# Patient Record
Sex: Male | Born: 1975
Health system: Southern US, Community
[De-identification: ages and names within clinical notes are randomized; demographics above are authoritative.]

## PROBLEM LIST (undated history)

## (undated) ENCOUNTER — Emergency Department (HOSPITAL_COMMUNITY): Disposition: A | Discharge status: Home / Self Care | Payer: Self-pay | Source: Home / Self Care

## (undated) DIAGNOSIS — G44009 Cluster headache syndrome, unspecified, not intractable: Secondary | ICD-10-CM

## (undated) DIAGNOSIS — T4145XA Adverse effect of unspecified anesthetic, initial encounter: Secondary | ICD-10-CM

## (undated) DIAGNOSIS — G500 Trigeminal neuralgia: Secondary | ICD-10-CM

## (undated) DIAGNOSIS — M792 Neuralgia and neuritis, unspecified: Secondary | ICD-10-CM

## (undated) DIAGNOSIS — G35 Multiple sclerosis: Secondary | ICD-10-CM

## (undated) DIAGNOSIS — J449 Chronic obstructive pulmonary disease, unspecified: Secondary | ICD-10-CM

## (undated) DIAGNOSIS — T8859XA Other complications of anesthesia, initial encounter: Secondary | ICD-10-CM

## (undated) DIAGNOSIS — R112 Nausea with vomiting, unspecified: Secondary | ICD-10-CM

## (undated) DIAGNOSIS — G40909 Epilepsy, unspecified, not intractable, without status epilepticus: Secondary | ICD-10-CM

## (undated) DIAGNOSIS — R569 Unspecified convulsions: Secondary | ICD-10-CM

## (undated) DIAGNOSIS — Z9889 Other specified postprocedural states: Secondary | ICD-10-CM

## (undated) HISTORY — PX: UPPER GASTROINTESTINAL ENDOSCOPY: SHX188

## (undated) HISTORY — PX: WISDOM TOOTH EXTRACTION: SHX21

---

## 2001-05-21 ENCOUNTER — Emergency Department (HOSPITAL_COMMUNITY): Admission: EM | Admit: 2001-05-21 | Discharge: 2001-05-21 | Payer: Self-pay

## 2001-05-24 ENCOUNTER — Emergency Department (HOSPITAL_COMMUNITY): Admission: EM | Admit: 2001-05-24 | Discharge: 2001-05-24 | Payer: Self-pay | Admitting: Emergency Medicine

## 2001-05-28 ENCOUNTER — Encounter (HOSPITAL_COMMUNITY): Admission: RE | Admit: 2001-05-28 | Discharge: 2001-08-26 | Payer: Self-pay | Admitting: Emergency Medicine

## 2001-05-29 ENCOUNTER — Emergency Department (HOSPITAL_COMMUNITY): Admission: EM | Admit: 2001-05-29 | Discharge: 2001-05-29 | Payer: Self-pay | Admitting: Emergency Medicine

## 2001-06-19 ENCOUNTER — Emergency Department (HOSPITAL_COMMUNITY): Admission: EM | Admit: 2001-06-19 | Discharge: 2001-06-19 | Payer: Self-pay | Admitting: Emergency Medicine

## 2012-07-31 ENCOUNTER — Encounter (HOSPITAL_BASED_OUTPATIENT_CLINIC_OR_DEPARTMENT_OTHER): Payer: Self-pay | Admitting: Emergency Medicine

## 2012-07-31 ENCOUNTER — Emergency Department (HOSPITAL_BASED_OUTPATIENT_CLINIC_OR_DEPARTMENT_OTHER): Payer: Medicare Other

## 2012-07-31 ENCOUNTER — Emergency Department (HOSPITAL_BASED_OUTPATIENT_CLINIC_OR_DEPARTMENT_OTHER)
Admission: EM | Admit: 2012-07-31 | Discharge: 2012-07-31 | Disposition: A | Discharge status: Home / Self Care | Payer: Medicare Other | Attending: Emergency Medicine | Admitting: Emergency Medicine

## 2012-07-31 DIAGNOSIS — R1031 Right lower quadrant pain: Secondary | ICD-10-CM | POA: Insufficient documentation

## 2012-07-31 DIAGNOSIS — G35 Multiple sclerosis: Secondary | ICD-10-CM | POA: Insufficient documentation

## 2012-07-31 DIAGNOSIS — R197 Diarrhea, unspecified: Secondary | ICD-10-CM | POA: Insufficient documentation

## 2012-07-31 DIAGNOSIS — R109 Unspecified abdominal pain: Secondary | ICD-10-CM

## 2012-07-31 DIAGNOSIS — Z87891 Personal history of nicotine dependence: Secondary | ICD-10-CM | POA: Insufficient documentation

## 2012-07-31 DIAGNOSIS — R110 Nausea: Secondary | ICD-10-CM | POA: Insufficient documentation

## 2012-07-31 HISTORY — DX: Cluster headache syndrome, unspecified, not intractable: G44.009

## 2012-07-31 HISTORY — DX: Multiple sclerosis: G35

## 2012-07-31 LAB — CBC
HCT: 45.1 % (ref 39.0–52.0)
MCH: 30.5 pg (ref 26.0–34.0)
MCV: 84.9 fL (ref 78.0–100.0)
Platelets: 205 10*3/uL (ref 150–400)
RDW: 12 % (ref 11.5–15.5)

## 2012-07-31 LAB — URINALYSIS, ROUTINE W REFLEX MICROSCOPIC
Bilirubin Urine: NEGATIVE
Glucose, UA: NEGATIVE mg/dL
Hgb urine dipstick: NEGATIVE
Protein, ur: NEGATIVE mg/dL
Urobilinogen, UA: 0.2 mg/dL (ref 0.0–1.0)

## 2012-07-31 LAB — COMPREHENSIVE METABOLIC PANEL
AST: 17 U/L (ref 0–37)
BUN: 14 mg/dL (ref 6–23)
CO2: 27 mEq/L (ref 19–32)
Calcium: 9.5 mg/dL (ref 8.4–10.5)
Creatinine, Ser: 0.9 mg/dL (ref 0.50–1.35)
GFR calc non Af Amer: >90 mL/min (ref >90)
Total Bilirubin: 0.7 mg/dL (ref 0.3–1.2)

## 2012-07-31 MED ORDER — IOHEXOL 300 MG/ML  SOLN: 25.0000 mL | INTRAMUSCULAR | Status: AC

## 2012-07-31 MED ORDER — SODIUM CHLORIDE 0.9 % IV BOLUS (SEPSIS)
1000.0000 mL | Freq: Once | INTRAVENOUS | Status: AC
  Administered 2012-07-31: 1000 mL via INTRAVENOUS

## 2012-07-31 MED ORDER — HYDROCODONE-ACETAMINOPHEN 5-500 MG PO TABS: 1.0000 | ORAL_TABLET | Freq: Four times a day (QID) | ORAL | Status: DC | PRN

## 2012-07-31 MED ORDER — IOHEXOL 300 MG/ML  SOLN
100.0000 mL | Freq: Once | INTRAMUSCULAR | Status: AC | PRN
  Administered 2012-07-31: 100 mL via INTRAVENOUS

## 2012-07-31 MED ORDER — ONDANSETRON HCL 4 MG/2ML IJ SOLN
4.0000 mg | Freq: Once | INTRAMUSCULAR | Status: AC
  Administered 2012-07-31: 4 mg via INTRAVENOUS
  Filled 2012-07-31: qty 2

## 2012-07-31 MED ORDER — ONDANSETRON HCL 4 MG PO TABS: 4.0000 mg | ORAL_TABLET | Freq: Four times a day (QID) | ORAL | Status: DC

## 2012-07-31 MED ORDER — MORPHINE SULFATE 4 MG/ML IJ SOLN
4.0000 mg | Freq: Once | INTRAMUSCULAR | Status: AC
Start: 2012-07-31 — End: 2012-07-31
  Administered 2012-07-31: 4 mg via INTRAVENOUS
  Filled 2012-07-31: qty 1

## 2012-07-31 NOTE — ED Notes (Signed)
RLQ pain intermittently x2 months.  Constant and severe since yesterday.  Nausea and diarrhea.  Also some suprapubic pain.

## 2012-07-31 NOTE — ED Provider Notes (Signed)
History     CSN: [id number]  Arrival date & time 07/31/12  1635   First MD Initiated Contact with Patient 07/31/12 1813      Chief Complaint  Patient presents with  . Abdominal Pain  . Nausea    (Consider location/radiation/quality/duration/timing/severity/associated sxs/prior treatment) HPI Comments: Pt complains of intermittent pain to RLQ for the last 2 months.  Has been worse and constant for last 2 days.  No NV.  Had diarrhea yesterday.  No fevers.  Describes as achy pain to RLQ, at times worsens.  AT times radiates to back, groin.  No testicular pain.  No UTI symptoms other than frequency.  Not taking any meds for pain.  The history is provided by the patient.    Past Medical History  Diagnosis Date  . MS (multiple sclerosis)   . Cluster headache     Past Surgical History  Procedure Date  . Upper gastrointestinal endoscopy     No family history on file.  History  Substance Use Topics  . Smoking status: Former Games developer  . Smokeless tobacco: Not on file  . Alcohol Use: Yes     occasionally      Review of Systems  Constitutional: Negative for fever, chills, diaphoresis and fatigue.  HENT: Negative for congestion, rhinorrhea and sneezing.   Eyes: Negative.   Respiratory: Negative for cough, chest tightness and shortness of breath.   Cardiovascular: Negative for chest pain and leg swelling.  Gastrointestinal: Positive for abdominal pain and diarrhea. Negative for nausea, vomiting and blood in stool.  Genitourinary: Positive for frequency. Negative for hematuria, flank pain and difficulty urinating.  Musculoskeletal: Positive for back pain. Negative for arthralgias.  Skin: Negative for rash.  Neurological: Negative for dizziness, speech difficulty, weakness, numbness and headaches.    Allergies  Review of patient's allergies indicates no known allergies.  Home Medications   Current Outpatient Rx  Name Route Sig Dispense Refill  .  HYDROCODONE-ACETAMINOPHEN 5-500 MG PO TABS Oral Take 1-2 tablets by mouth every 6 (six) hours as needed for pain. 15 tablet 0  . ONDANSETRON HCL 4 MG PO TABS Oral Take 1 tablet (4 mg total) by mouth every 6 (six) hours. 12 tablet 0    BP 130/75  Pulse 98  Temp 98.3 F (36.8 C) (Oral)  Resp 18  Ht 5\' 10"  (1.778 m)  Wt 132 lb (59.875 kg)  BMI 18.94 kg/m2  SpO2 100%  Physical Exam  Constitutional: He is oriented to person, place, and time. He appears well-developed and well-nourished.  HENT:  Head: Normocephalic and atraumatic.  Eyes: Pupils are equal, round, and reactive to light.  Neck: Normal range of motion. Neck supple.  Cardiovascular: Normal rate, regular rhythm and normal heart sounds.   Pulmonary/Chest: Effort normal and breath sounds normal. No respiratory distress. He has no wheezes. He has no rales. He exhibits no tenderness.  Abdominal: Soft. Bowel sounds are normal. There is tenderness (moderate pain to RLQ). There is no rebound and no guarding.  Genitourinary:       No pain to inguinal area/testicle  Musculoskeletal: Normal range of motion. He exhibits no edema.  Lymphadenopathy:    He has no cervical adenopathy.  Neurological: He is alert and oriented to person, place, and time.  Skin: Skin is warm and dry. No rash noted.  Psychiatric: He has a normal mood and affect.    ED Course  Procedures (including critical care time)  Results for orders placed during the hospital encounter of  07/31/12  URINALYSIS, ROUTINE W REFLEX MICROSCOPIC      Component Value Range   Color, Urine YELLOW  YELLOW   APPearance CLEAR  CLEAR   Specific Gravity, Urine 1.027  1.005 - 1.030   pH 5.0  5.0 - 8.0   Glucose, UA NEGATIVE  NEGATIVE mg/dL   Hgb urine dipstick NEGATIVE  NEGATIVE   Bilirubin Urine NEGATIVE  NEGATIVE   Ketones, ur NEGATIVE  NEGATIVE mg/dL   Protein, ur NEGATIVE  NEGATIVE mg/dL   Urobilinogen, UA 0.2  0.0 - 1.0 mg/dL   Nitrite NEGATIVE  NEGATIVE   Leukocytes, UA  NEGATIVE  NEGATIVE  CBC      Component Value Range   WBC 7.6  4.0 - 10.5 K/uL   RBC 5.31  4.22 - 5.81 MIL/uL   Hemoglobin 16.2  13.0 - 17.0 g/dL   HCT 16.1  09.6 - 04.5 %   MCV 84.9  78.0 - 100.0 fL   MCH 30.5  26.0 - 34.0 pg   MCHC 35.9  30.0 - 36.0 g/dL   RDW 40.9  81.1 - 91.4 %   Platelets 205  150 - 400 K/uL  COMPREHENSIVE METABOLIC PANEL      Component Value Range   Sodium 137  135 - 145 mEq/L   Potassium 3.6  3.5 - 5.1 mEq/L   Chloride 99  96 - 112 mEq/L   CO2 27  19 - 32 mEq/L   Glucose, Bld 94  70 - 99 mg/dL   BUN 14  6 - 23 mg/dL   Creatinine, Ser 7.82  0.50 - 1.35 mg/dL   Calcium 9.5  8.4 - 95.6 mg/dL   Total Protein 7.0  6.0 - 8.3 g/dL   Albumin 4.2  3.5 - 5.2 g/dL   AST 17  0 - 37 U/L   ALT 15  0 - 53 U/L   Alkaline Phosphatase 112  39 - 117 U/L   Total Bilirubin 0.7  0.3 - 1.2 mg/dL   GFR calc non Af Amer >90  >90 mL/min   GFR calc Af Amer >90  >90 mL/min   Ct Abdomen Pelvis W Contrast  07/31/2012  *RADIOLOGY REPORT*  Clinical Data: Right lower quadrant and groin pain.  Nausea. Diarrhea.  CT ABDOMEN AND PELVIS WITH CONTRAST  Technique:  Multidetector CT imaging of the abdomen and pelvis was performed following the standard protocol during bolus administration of intravenous contrast.  Contrast: OMNIPAQUE IOHEXOL 300 MG/ML  SOLN  Comparison: None.  Findings: The abdominal parenchymal organs are normal in appearance.  Gallbladder is unremarkable.  No evidence of hydronephrosis.  No soft tissue masses or lymphadenopathy identified within the abdomen or pelvis.  Appendix is not directly visualized on the study, but no inflammatory process is seen in the region of the appendix or elsewhere within the abdomen or pelvis.  No evidence of bowel wall thickening or dilatation.  No hernia identified.  IMPRESSION: Negative.  No acute findings or other significant abnormality identified.   Original Report Authenticated By: Danae Orleans, M.D.       1. Abdominal pain        MDM  Pt in no distress, comfortable appearing.  Ct neg.  No evidence of appendicitis.  No pain over gallbladder.  Will d/c.  Advised to f/u with his PMD on Monday for a recheck or return here if symptoms worsen.  Pt already has appt with his PMD on Monday.  Will also refer to GI since  he has had the pain intermittently for 2 months        Rolan Bucco, MD 07/31/12 2057

## 2012-08-20 ENCOUNTER — Emergency Department (HOSPITAL_BASED_OUTPATIENT_CLINIC_OR_DEPARTMENT_OTHER)
Admission: EM | Admit: 2012-08-20 | Discharge: 2012-08-20 | Disposition: A | Discharge status: Home / Self Care | Payer: Medicare Other | Attending: Emergency Medicine | Admitting: Emergency Medicine

## 2012-08-20 ENCOUNTER — Encounter (HOSPITAL_BASED_OUTPATIENT_CLINIC_OR_DEPARTMENT_OTHER): Payer: Self-pay | Admitting: *Deleted

## 2012-08-20 ENCOUNTER — Emergency Department (HOSPITAL_BASED_OUTPATIENT_CLINIC_OR_DEPARTMENT_OTHER): Payer: Medicare Other

## 2012-08-20 DIAGNOSIS — Z87891 Personal history of nicotine dependence: Secondary | ICD-10-CM | POA: Insufficient documentation

## 2012-08-20 DIAGNOSIS — G35 Multiple sclerosis: Secondary | ICD-10-CM | POA: Insufficient documentation

## 2012-08-20 DIAGNOSIS — Z8669 Personal history of other diseases of the nervous system and sense organs: Secondary | ICD-10-CM | POA: Insufficient documentation

## 2012-08-20 DIAGNOSIS — R109 Unspecified abdominal pain: Secondary | ICD-10-CM

## 2012-08-20 LAB — COMPREHENSIVE METABOLIC PANEL
ALT: 15 U/L (ref 0–53)
AST: 16 U/L (ref 0–37)
Alkaline Phosphatase: 95 U/L (ref 39–117)
CO2: 25 mEq/L (ref 19–32)
Calcium: 9 mg/dL (ref 8.4–10.5)
Chloride: 100 mEq/L (ref 96–112)
GFR calc Af Amer: >90 mL/min (ref >90)
GFR calc non Af Amer: >90 mL/min (ref >90)
Glucose, Bld: 104 mg/dL — ABNORMAL HIGH (ref 70–99)
Potassium: 3.6 mEq/L (ref 3.5–5.1)
Sodium: 136 mEq/L (ref 135–145)

## 2012-08-20 LAB — CBC WITH DIFFERENTIAL/PLATELET
Basophils Absolute: 0 10*3/uL (ref 0.0–0.1)
Eosinophils Relative: 1 % (ref 0–5)
Lymphocytes Relative: 37 % (ref 12–46)
Lymphs Abs: 2.5 10*3/uL (ref 0.7–4.0)
MCV: 85.2 fL (ref 78.0–100.0)
Neutro Abs: 3.6 10*3/uL (ref 1.7–7.7)
Neutrophils Relative %: 53 % (ref 43–77)
Platelets: 194 10*3/uL (ref 150–400)
RBC: 4.88 MIL/uL (ref 4.22–5.81)
RDW: 12.1 % (ref 11.5–15.5)
WBC: 6.8 10*3/uL (ref 4.0–10.5)

## 2012-08-20 LAB — URINALYSIS, ROUTINE W REFLEX MICROSCOPIC
Hgb urine dipstick: NEGATIVE
Leukocytes, UA: NEGATIVE
Nitrite: NEGATIVE
Protein, ur: NEGATIVE mg/dL
Specific Gravity, Urine: 1.023 (ref 1.005–1.030)
Urobilinogen, UA: 1 mg/dL (ref 0.0–1.0)

## 2012-08-20 NOTE — ED Notes (Signed)
C/o pain to RLQ and groin area with some diff urinating. Denies any fever but has had chills.

## 2012-08-20 NOTE — ED Notes (Signed)
Spoke with pts wife by permission of pt. She stated that she is concerned about a hernia with the pt and wanted to be sure we tested for it and ruled it out.  She has been reading about it on the internet and is concerned about strangulation.  MD was notified about wife's concerns.

## 2012-08-20 NOTE — ED Provider Notes (Signed)
History     CSN: [id number]  Arrival date & time 08/20/12  1958   First MD Initiated Contact with Patient 08/20/12 2011      Chief Complaint  Patient presents with  . Groin Pain    (Consider location/radiation/quality/duration/timing/severity/associated sxs/prior treatment) HPI Comments: Patient presents with pain in the rlq, and groin for the past several weeks.  He denies any injury or trauma.  No fevers or chills.  He feels as though there is something poking out of his abdomen from time to time.  He was seen here two weeks ago for the same and underwent a ct of the abdomen which showed no appendicitis or alternate diagnosis.  No n/v/d.  Has tried taking laxatives but has not had any relief.    Patient is a 36 y.o. male presenting with groin pain. The history is provided by the patient.  Groin Pain Chronicity: three weeks ago. The problem occurs constantly. The problem has been gradually worsening. Associated symptoms include abdominal pain. Nothing aggravates the symptoms. Nothing relieves the symptoms. He has tried nothing for the symptoms.    Past Medical History  Diagnosis Date  . MS (multiple sclerosis)   . Cluster headache     Past Surgical History  Procedure Date  . Upper gastrointestinal endoscopy     History reviewed. No pertinent family history.  History  Substance Use Topics  . Smoking status: Former Games developer  . Smokeless tobacco: Not on file  . Alcohol Use: Yes     Comment: occasionally      Review of Systems  Gastrointestinal: Positive for abdominal pain.  All other systems reviewed and are negative.    Allergies  Review of patient's allergies indicates no known allergies.  Home Medications   Current Outpatient Rx  Name  Route  Sig  Dispense  Refill  . HYDROCODONE-ACETAMINOPHEN 5-500 MG PO TABS   Oral   Take 1-2 tablets by mouth every 6 (six) hours as needed for pain.   15 tablet   0   . ONDANSETRON HCL 4 MG PO TABS   Oral   Take 1  tablet (4 mg total) by mouth every 6 (six) hours.   12 tablet   0     BP 129/77  Pulse 76  Temp 98.4 F (36.9 C) (Oral)  Resp 18  Ht 5\' 10"  (1.778 m)  Wt 136 lb (61.689 kg)  BMI 19.51 kg/m2  SpO2 100%  Physical Exam  Nursing note and vitals reviewed. Constitutional: He is oriented to person, place, and time. He appears well-developed and well-nourished. No distress.  HENT:  Head: Normocephalic and atraumatic.  Mouth/Throat: Oropharynx is clear and moist.  Neck: Normal range of motion. Neck supple.  Cardiovascular: Normal rate and regular rhythm.   No murmur heard. Pulmonary/Chest: Effort normal and breath sounds normal.  Abdominal: Soft.       There is ttp in the right lower quadrant just below and right of the umbilicus.  There is no ttp at McBurney's point.  There is no rebound or guarding.  There is no inguinal hernia or defect palpable.  The testicles and penis appear grossly normal.  Musculoskeletal: Normal range of motion. He exhibits no edema.  Neurological: He is alert and oriented to person, place, and time.  Skin: Skin is warm and dry. He is not diaphoretic.    ED Course  Procedures (including critical care time)  Labs Reviewed  URINALYSIS, ROUTINE W REFLEX MICROSCOPIC - Abnormal; Notable for the following:  APPearance CLOUDY (*)     All other components within normal limits  CBC WITH DIFFERENTIAL  COMPREHENSIVE METABOLIC PANEL   No results found.   No diagnosis found.    MDM  The patient presents here for eval of rlq and groin pain for the past three weeks.  The ct from two weeks ago was unremarkable.  His exam is benign and I am unable to palpate any hernias. The labs are unremarkable and the xrays show stool in the rlq where he is having the pain.  I suspect this is the cause of his symptoms.  Will treat with mag citrate, follow up with surgery if not improving in the next 2-3 days.          [name], MD 08/20/12 2141

## 2012-08-20 NOTE — ED Notes (Signed)
MD at bedside. 

## 2012-12-30 ENCOUNTER — Emergency Department (HOSPITAL_BASED_OUTPATIENT_CLINIC_OR_DEPARTMENT_OTHER): Payer: Medicare Other

## 2012-12-30 ENCOUNTER — Encounter (HOSPITAL_BASED_OUTPATIENT_CLINIC_OR_DEPARTMENT_OTHER): Payer: Self-pay | Admitting: Emergency Medicine

## 2012-12-30 ENCOUNTER — Emergency Department (HOSPITAL_BASED_OUTPATIENT_CLINIC_OR_DEPARTMENT_OTHER)
Admission: EM | Admit: 2012-12-30 | Discharge: 2012-12-30 | Disposition: A | Discharge status: Home / Self Care | Payer: Medicare Other | Attending: Emergency Medicine | Admitting: Emergency Medicine

## 2012-12-30 DIAGNOSIS — Z8669 Personal history of other diseases of the nervous system and sense organs: Secondary | ICD-10-CM | POA: Insufficient documentation

## 2012-12-30 DIAGNOSIS — R1031 Right lower quadrant pain: Secondary | ICD-10-CM | POA: Insufficient documentation

## 2012-12-30 DIAGNOSIS — R109 Unspecified abdominal pain: Secondary | ICD-10-CM

## 2012-12-30 DIAGNOSIS — K5900 Constipation, unspecified: Secondary | ICD-10-CM | POA: Insufficient documentation

## 2012-12-30 DIAGNOSIS — Z87891 Personal history of nicotine dependence: Secondary | ICD-10-CM | POA: Insufficient documentation

## 2012-12-30 DIAGNOSIS — N433 Hydrocele, unspecified: Secondary | ICD-10-CM

## 2012-12-30 HISTORY — DX: Epilepsy, unspecified, not intractable, without status epilepticus: G40.909

## 2012-12-30 LAB — URINALYSIS, ROUTINE W REFLEX MICROSCOPIC
Ketones, ur: NEGATIVE mg/dL
Leukocytes, UA: NEGATIVE
Nitrite: NEGATIVE
Protein, ur: NEGATIVE mg/dL

## 2012-12-30 LAB — LIPASE, BLOOD: Lipase: 22 U/L (ref 11–59)

## 2012-12-30 LAB — CBC WITH DIFFERENTIAL/PLATELET
Basophils Absolute: 0 10*3/uL (ref 0.0–0.1)
Basophils Relative: 1 % (ref 0–1)
MCHC: 35.5 g/dL (ref 30.0–36.0)
Neutro Abs: 4.2 10*3/uL (ref 1.7–7.7)
Neutrophils Relative %: 62 % (ref 43–77)
Platelets: 158 10*3/uL (ref 150–400)
RDW: 12.1 % (ref 11.5–15.5)
WBC: 6.7 10*3/uL (ref 4.0–10.5)

## 2012-12-30 LAB — COMPREHENSIVE METABOLIC PANEL
Albumin: 3.8 g/dL (ref 3.5–5.2)
BUN: 16 mg/dL (ref 6–23)
Calcium: 9.2 mg/dL (ref 8.4–10.5)
Chloride: 105 mEq/L (ref 96–112)
Creatinine, Ser: 0.9 mg/dL (ref 0.50–1.35)
GFR calc non Af Amer: >90 mL/min (ref >90)
Total Bilirubin: 0.6 mg/dL (ref 0.3–1.2)

## 2012-12-30 MED ORDER — IOHEXOL 300 MG/ML  SOLN
100.0000 mL | Freq: Once | INTRAMUSCULAR | Status: AC | PRN
  Administered 2012-12-30: 100 mL via INTRAVENOUS

## 2012-12-30 MED ORDER — MORPHINE SULFATE 4 MG/ML IJ SOLN
4.0000 mg | Freq: Once | INTRAMUSCULAR | Status: AC
Start: 2012-12-30 — End: 2012-12-30
  Administered 2012-12-30: 4 mg via INTRAVENOUS
  Filled 2012-12-30: qty 1

## 2012-12-30 MED ORDER — IOHEXOL 300 MG/ML  SOLN
50.0000 mL | Freq: Once | INTRAMUSCULAR | Status: AC | PRN
  Administered 2012-12-30: 50 mL via ORAL

## 2012-12-30 NOTE — ED Notes (Signed)
Pt states that he noticed that he experienced cramping in his groin and testicles, that leads up to his RLQ. Pt also states that he developed a small bulge in his RLQ after urinating. Significant other has a picture. Pt reports that he develops constipation after these episodes and this has happened before.

## 2012-12-30 NOTE — ED Provider Notes (Signed)
Medical screening examination/treatment/procedure(s) were performed by non-physician practitioner and as supervising physician I was immediately available for consultation/collaboration.   Charles B. Bernette Mayers, MD 12/30/12 2001

## 2012-12-30 NOTE — ED Provider Notes (Signed)
History     CSN: [id number]  Arrival date & time 12/30/12  1412   First MD Initiated Contact with Patient 12/30/12 1434      Chief Complaint  Patient presents with  . Abdominal Pain  . Groin Pain    (Consider location/radiation/quality/duration/timing/severity/associated sxs/prior treatment) HPI Comments: Pt states that intermittently over the last year and he feels a bulge in the right groin:pt states that when the episode happens the bulge goes away and then he has excruciating pain for several days:pt states that he has been seen for the problem previously but it has been several months:pt denies fever, vomiting:pt states that he does have problems with severe constipation when he has theses episodes:pt states that the pain also seems to go into the right testicle  Patient is a 37 y.o. male presenting with abdominal pain. The history is provided by the patient. No language interpreter was used.  Abdominal Pain Pain location:  RLQ Pain quality: aching and sharp   Pain radiates to:  Groin Pain severity:  Severe Onset quality:  Sudden Relieved by:  Nothing Worsened by:  Nothing tried Ineffective treatments:  Eating Associated symptoms: constipation   Associated symptoms: no dysuria, no fever, no nausea and no vomiting     Past Medical History  Diagnosis Date  . MS (multiple sclerosis)   . Cluster headache   . Epilepsy     Past Surgical History  Procedure Laterality Date  . Upper gastrointestinal endoscopy      History reviewed. No pertinent family history.  History  Substance Use Topics  . Smoking status: Former Games developer  . Smokeless tobacco: Not on file  . Alcohol Use: No     Comment: occasionally      Review of Systems  Constitutional: Negative for fever.  Respiratory: Negative.   Cardiovascular: Negative.   Gastrointestinal: Positive for abdominal pain and constipation. Negative for nausea and vomiting.  Genitourinary: Negative for dysuria.     Allergies  Review of patient's allergies indicates no known allergies.  Home Medications   Current Outpatient Rx  Name  Route  Sig  Dispense  Refill  . HYDROcodone-acetaminophen (VICODIN) 5-500 MG per tablet   Oral   Take 1-2 tablets by mouth every 6 (six) hours as needed for pain.   15 tablet   0   . ondansetron (ZOFRAN) 4 MG tablet   Oral   Take 1 tablet (4 mg total) by mouth every 6 (six) hours.   12 tablet   0     BP 128/73  Pulse 77  Temp(Src) 97.9 F (36.6 C) (Oral)  Resp 20  Wt 136 lb (61.689 kg)  BMI 19.51 kg/m2  SpO2 99%  Physical Exam  Nursing note and vitals reviewed. Constitutional: He is oriented to person, place, and time. He appears well-developed and well-nourished.  HENT:  Head: Atraumatic.  Eyes: Conjunctivae and EOM are normal.  Cardiovascular: Normal rate and regular rhythm.   Pulmonary/Chest: Effort normal and breath sounds normal.  Abdominal: Soft. Bowel sounds are normal. There is tenderness in the right lower quadrant. A hernia is present. Hernia confirmed positive in the right inguinal area.  Genitourinary: No penile tenderness.  Musculoskeletal: Normal range of motion.  Neurological: He is alert and oriented to person, place, and time.  Skin: Skin is warm and dry.  Psychiatric: He has a normal mood and affect.    ED Course  Procedures (including critical care time)  Labs Reviewed  COMPREHENSIVE METABOLIC PANEL  LIPASE, BLOOD  URINALYSIS, ROUTINE W REFLEX MICROSCOPIC  CBC WITH DIFFERENTIAL   US Scrotum  12/30/2012  *RADIOLOGY REPORT*  Clinical Data:  Right groin pain  SCROTAL ULTRASOUND DOPPLER ULTRASOUND OF THE TESTICLES  Technique: Complete ultrasound examination of the testicles, epididymis, and other scrotal structures was performed.  Color and spectral Doppler ultrasound were also utilized to evaluate blood flow to the testicles.  Comparison:  None  Findings:  Right testis:  4.6 x 2.3 x 2.7 cm.  Several small calcifications  in the testicle without mass lesion.  Left testis:  4.1 x 2.4 x 3.0 cm.  Homogeneous left testicle.  Right epididymis:  Normal in size and appearance.  Left epididymis:  Normal in size and appearance.  Hydrocele:  Bilateral hydrocele  Varicocele:  Absent  Pulsed Doppler interrogation of both testes demonstrates low resistance flow bilaterally.  IMPRESSION: No significant testicular abnormality.  Small calcification right testicle not likely significant.  Bilateral hydrocele.  Negative for testicular torsion.   Original Report Authenticated By: Janeece Riggers, M.D.    Ct Abdomen Pelvis W Contrast  12/30/2012  *RADIOLOGY REPORT*  Clinical Data: Right lower quadrant pain  CT ABDOMEN AND PELVIS WITH CONTRAST  Technique:  Multidetector CT imaging of the abdomen and pelvis was performed following the standard protocol during bolus administration of intravenous contrast.  Contrast: 50mL OMNIPAQUE IOHEXOL 300 MG/ML  SOLN, OMNIPAQUE IOHEXOL 300 MG/ML  SOLN  Comparison: 07/31/2012  Findings: Normal appendix.  Prominent stool burden in the ascending and proximal transverse colon.  Diffuse hepatic steatosis.  Gallbladder is decompressed.  Spleen, pancreas, adrenal glands, kidneys are within normal limits.  Bladder and prostate are within normal limits.  Prominent venous structures along the pelvic side walls are of unknown significance.  No destructive bone lesion.  No acute bony deformity. Stable sclerotic density in the right iliac bone.  IMPRESSION: No acute intra-abdominal or intrapelvic pathology.  No evidence of acute appendicitis.   Original Report Authenticated By: Jolaine Click, M.D.    [place] Art/ven Flow Abd Pelv Doppler  12/30/2012  *RADIOLOGY REPORT*  Clinical Data:  Right groin pain  SCROTAL ULTRASOUND DOPPLER ULTRASOUND OF THE TESTICLES  Technique: Complete ultrasound examination of the testicles, epididymis, and other scrotal structures was performed.  Color and spectral Doppler ultrasound were also utilized  to evaluate blood flow to the testicles.  Comparison:  None  Findings:  Right testis:  4.6 x 2.3 x 2.7 cm.  Several small calcifications in the testicle without mass lesion.  Left testis:  4.1 x 2.4 x 3.0 cm.  Homogeneous left testicle.  Right epididymis:  Normal in size and appearance.  Left epididymis:  Normal in size and appearance.  Hydrocele:  Bilateral hydrocele  Varicocele:  Absent  Pulsed Doppler interrogation of both testes demonstrates low resistance flow bilaterally.  IMPRESSION: No significant testicular abnormality.  Small calcification right testicle not likely significant.  Bilateral hydrocele.  Negative for testicular torsion.   Original Report Authenticated By: Janeece Riggers, M.D.      1. Abdominal pain   2. Hydrocele       MDM  No acute process noted:discussed with pt that he could be having hernia that is reducing:don't have a explanation for why pt would have excruciating pain:no torsion noted;pt in no pain at this time        Teressa Lower, NP 12/30/12 1758

## 2012-12-30 NOTE — ED Notes (Signed)
Patient getting dressed for discharge. 

## 2012-12-30 NOTE — ED Notes (Signed)
MD at bedside. 

## 2013-01-12 ENCOUNTER — Ambulatory Visit (INDEPENDENT_AMBULATORY_CARE_PROVIDER_SITE_OTHER): Payer: Self-pay | Admitting: Surgery

## 2013-01-13 ENCOUNTER — Encounter (HOSPITAL_BASED_OUTPATIENT_CLINIC_OR_DEPARTMENT_OTHER): Payer: Self-pay | Admitting: Emergency Medicine

## 2013-01-13 ENCOUNTER — Emergency Department (HOSPITAL_BASED_OUTPATIENT_CLINIC_OR_DEPARTMENT_OTHER): Payer: Medicare Other

## 2013-01-13 ENCOUNTER — Emergency Department (HOSPITAL_BASED_OUTPATIENT_CLINIC_OR_DEPARTMENT_OTHER)
Admission: EM | Admit: 2013-01-13 | Discharge: 2013-01-13 | Disposition: A | Discharge status: Home / Self Care | Payer: Medicare Other | Attending: Emergency Medicine | Admitting: Emergency Medicine

## 2013-01-13 DIAGNOSIS — S46909A Unspecified injury of unspecified muscle, fascia and tendon at shoulder and upper arm level, unspecified arm, initial encounter: Secondary | ICD-10-CM | POA: Insufficient documentation

## 2013-01-13 DIAGNOSIS — W108XXA Fall (on) (from) other stairs and steps, initial encounter: Secondary | ICD-10-CM | POA: Insufficient documentation

## 2013-01-13 DIAGNOSIS — S4980XA Other specified injuries of shoulder and upper arm, unspecified arm, initial encounter: Secondary | ICD-10-CM | POA: Insufficient documentation

## 2013-01-13 DIAGNOSIS — Y9301 Activity, walking, marching and hiking: Secondary | ICD-10-CM | POA: Insufficient documentation

## 2013-01-13 DIAGNOSIS — Z87891 Personal history of nicotine dependence: Secondary | ICD-10-CM | POA: Insufficient documentation

## 2013-01-13 DIAGNOSIS — M25512 Pain in left shoulder: Secondary | ICD-10-CM

## 2013-01-13 DIAGNOSIS — Y92009 Unspecified place in unspecified non-institutional (private) residence as the place of occurrence of the external cause: Secondary | ICD-10-CM | POA: Insufficient documentation

## 2013-01-13 DIAGNOSIS — Z8669 Personal history of other diseases of the nervous system and sense organs: Secondary | ICD-10-CM | POA: Insufficient documentation

## 2013-01-13 DIAGNOSIS — W19XXXA Unspecified fall, initial encounter: Secondary | ICD-10-CM

## 2013-01-13 MED ORDER — OXYCODONE-ACETAMINOPHEN 5-325 MG PO TABS
2.0000 | ORAL_TABLET | Freq: Once | ORAL | Status: DC
  Filled 2013-01-13 (×2): qty 2

## 2013-01-13 MED ORDER — OXYCODONE-ACETAMINOPHEN 5-325 MG PO TABS
1.0000 | ORAL_TABLET | Freq: Once | ORAL | Status: AC
  Administered 2013-01-13: 1 via ORAL
  Filled 2013-01-13 (×2): qty 1

## 2013-01-13 MED ORDER — OXYCODONE-ACETAMINOPHEN 5-325 MG PO TABS
1.0000 | ORAL_TABLET | Freq: Once | ORAL | Status: AC
  Administered 2013-01-13: 1 via ORAL
  Filled 2013-01-13: qty 1

## 2013-01-13 MED ORDER — IBUPROFEN 800 MG PO TABS
800.0000 mg | ORAL_TABLET | Freq: Once | ORAL | Status: AC
  Administered 2013-01-13: 800 mg via ORAL
  Filled 2013-01-13: qty 1

## 2013-01-13 NOTE — ED Provider Notes (Addendum)
History     CSN: [id number]  Arrival date & time 01/13/13  0045   First MD Initiated Contact with Patient 01/13/13 0130      Chief Complaint  Patient presents with  . Fall  . Arm Injury  . Rib Injury    (Consider location/radiation/quality/duration/timing/severity/associated sxs/prior treatment) Patient is a 37 y.o. male presenting with fall and arm injury. The history is provided by the patient.  Fall The accident occurred 1 to 2 hours ago. The fall occurred while walking. He fell from a height of 3 to 5 ft. He landed on dirt. There was no blood loss. The point of impact was the left shoulder. The pain is present in the left shoulder. The pain is at a severity of 8/10. The pain is severe. He was ambulatory at the scene. There was no entrapment after the fall. There was no drug use involved in the accident. There was no alcohol use involved in the accident. Pertinent negatives include no visual change, no fever, no numbness, no abdominal pain, no bowel incontinence, no nausea, no vomiting, no hematuria, no headaches, no hearing loss, no loss of consciousness and no tingling. The symptoms are aggravated by activity, use of the injured limb and pressure on the injury. He has tried nothing for the symptoms. The treatment provided no relief.  Arm Injury Associated symptoms: no fever      Past Medical History  Diagnosis Date  . MS (multiple sclerosis)   . Cluster headache   . Epilepsy     Past Surgical History  Procedure Laterality Date  . Upper gastrointestinal endoscopy      No family history on file.  History  Substance Use Topics  . Smoking status: Former Games developer  . Smokeless tobacco: Not on file  . Alcohol Use: No     Comment: occasionally      Review of Systems  Constitutional: Negative for fever.  Gastrointestinal: Negative for nausea, vomiting, abdominal pain and bowel incontinence.  Genitourinary: Negative for hematuria.  Neurological: Negative for tingling,  loss of consciousness, numbness and headaches.  All other systems reviewed and are negative.    Allergies  Review of patient's allergies indicates no known allergies.  Home Medications   Current Outpatient Rx  Name  Route  Sig  Dispense  Refill  . HYDROcodone-acetaminophen (VICODIN) 5-500 MG per tablet   Oral   Take 1-2 tablets by mouth every 6 (six) hours as needed for pain.   15 tablet   0   . ondansetron (ZOFRAN) 4 MG tablet   Oral   Take 1 tablet (4 mg total) by mouth every 6 (six) hours.   12 tablet   0     BP 116/78  Pulse 76  Temp(Src) 98.2 F (36.8 C) (Oral)  Resp 18  SpO2 97%  Physical Exam  Nursing note and vitals reviewed. Constitutional: He is oriented to person, place, and time. He appears well-developed and well-nourished.  HENT:  Head: Normocephalic and atraumatic.  Right Ear: External ear normal.  Left Ear: External ear normal.  Nose: Nose normal.  Mouth/Throat: Oropharynx is clear and moist.  Eyes: Conjunctivae and EOM are normal. Pupils are equal, round, and reactive to light.  Neck: Normal range of motion. Neck supple.  Cardiovascular: Normal rate and regular rhythm.   Pulmonary/Chest: Effort normal and breath sounds normal.  Abdominal: Soft. Bowel sounds are normal.  Musculoskeletal:  No external signs of trauma including bruising, deformity, or bleeding. There is diffuse tenderness over  the colon and shoulder. He has decread active range of movement of the shoulder with decreased lateral flexion and external and internal rotation. Elbow, and wrist with normal room. Radial pulses are intact and sensation is intact.  Neurological: He is alert and oriented to person, place, and time.  Skin: Skin is warm and dry.  Psychiatric: He has a normal mood and affect.    ED Course  Procedures (including critical care time)  Labs Reviewed - No data to display Dg Ribs Unilateral W/chest Left  01/13/2013  *RADIOLOGY REPORT*  Clinical Data: Left anterior  rib pain and shortness of breath after fall.  LEFT RIBS AND CHEST - 3+ VIEW  Comparison: Chest 08/20/2012  Findings: Mild hyperinflation. The heart size and pulmonary vascularity are normal. The lungs appear clear and expanded without focal air space disease or consolidation. No blunting of the costophrenic angles.  No pneumothorax.  Mediastinal contours appear intact.  No significant change since previous study.  The left ribs appear intact.  No displaced fractures or focal bone lesions are identified.  The BB marker is placed over the anterior costochondral junction of the left third rib.  IMPRESSION: No evidence of active pulmonary disease.  No displaced left rib fractures identified.   Original Report Authenticated By: Burman Nieves, M.D.    Dg Cervical Spine Complete  01/13/2013  *RADIOLOGY REPORT*  Clinical Data: Left sided neck pain after fall.  CERVICAL SPINE - COMPLETE 4+ VIEW  Comparison: None.  Findings: Normal alignment of the cervical vertebrae and facet joints.  Lateral masses of C1 appear symmetrical.  The odontoid process appears intact.  No vertebral compression deformities. Intervertebral disc space heights are preserved.  No prevertebral soft tissue swelling.  No focal bone lesion or bone destruction. Bone cortex and trabecular architecture appear intact.  IMPRESSION: No displaced cervical fractures identified.   Original Report Authenticated By: Burman Nieves, M.D.    Dg Shoulder Left  01/13/2013  *RADIOLOGY REPORT*  Clinical Data: Left shoulder pain after fall.  LEFT SHOULDER - 2+ VIEW  Comparison: None.  Findings: There is a vague osseous fragment demonstrated adjacent to the tip of the acromion.  This could represent calcific tendonitis, loose body, or old ununited ossicle.  No evidence of acute fracture or subluxation of the left shoulder.  No focal bone lesion or bone destruction.  Bone cortex and trabecular architecture appear intact.  Visualized left ribs are unremarkable.   IMPRESSION: Old ununited ossicle versus loose body or calcific tendonitis lateral to the acromion.  No acute bony abnormalities identified.   Original Report Authenticated By: Burman Nieves, M.D.      No diagnosis found.    MDM  Radiographic findings showed no acute changes. Patient we placed in a sling for the left shoulder. He is referred for followup.        Hilario Quarry, MD 01/13/13 1478  Hilario Quarry, MD 01/13/13 [phone]

## 2013-01-13 NOTE — ED Notes (Signed)
Pt was originally ordered 2 percocet>he refused one and the order was changed and one returned to pyxis.  Then he decided to take the other and Dr Rosalia Hammers verbally ordered the 2nd percocet which was given

## 2013-01-13 NOTE — ED Notes (Addendum)
Pt fell down 4 steps and c/o left shoulder, left arm and left sided rib pain.

## 2013-01-14 ENCOUNTER — Ambulatory Visit (INDEPENDENT_AMBULATORY_CARE_PROVIDER_SITE_OTHER): Payer: Medicare Other | Admitting: Family Medicine

## 2013-01-14 ENCOUNTER — Ambulatory Visit: Payer: Medicare Other | Admitting: Family Medicine

## 2013-01-14 ENCOUNTER — Encounter: Payer: Self-pay | Admitting: Family Medicine

## 2013-01-14 VITALS — BP 123/80 | HR 80 | Ht 71.0 in | Wt 130.0 lb

## 2013-01-14 DIAGNOSIS — M25519 Pain in unspecified shoulder: Secondary | ICD-10-CM

## 2013-01-14 DIAGNOSIS — M25512 Pain in left shoulder: Secondary | ICD-10-CM

## 2013-01-14 MED ORDER — HYDROCODONE-ACETAMINOPHEN 5-325 MG PO TABS: 1.0000 | ORAL_TABLET | Freq: Four times a day (QID) | ORAL | Status: DC | PRN

## 2013-01-14 NOTE — Patient Instructions (Addendum)
Your history and exam are consistent with a shoulder subluxation. Wear the sling as much as possible for next 2 weeks. Take ibuprofen 800 mg three times a day with food for pain and inflammation. Norco as needed for severe pain above this (no driving on this medication). Come out of sling a couple times a day to do simple motion exercises of elbow and wrist so they don't get too stiff. Ice or heat, whichever feels better 15 minutes at a time 3-4 times a day. Follow up with me in 2 weeks for reevaluation. Anticipate starting home exercise program to help regain your motion at this time.

## 2013-01-15 ENCOUNTER — Encounter: Payer: Self-pay | Admitting: Family Medicine

## 2013-01-15 DIAGNOSIS — M25512 Pain in left shoulder: Secondary | ICD-10-CM | POA: Insufficient documentation

## 2013-01-15 NOTE — Assessment & Plan Note (Signed)
consistent with shoulder subluxation with rotator cuff strain.  Start with sling regularly.  Icing, ibuprofen.  Norco as needed for severe pain.  Out of sling a couple times a day for wrist and elbow ROM.  F/u in 2 weeks for reevaluation.

## 2013-01-15 NOTE — Progress Notes (Signed)
  Subjective:    Patient ID: Jon Reeves, male    DOB: 02-13-1976, 37 y.o.   MRN: [id number]  PCP: Dr. Debroah Loop  HPI 37 yo M here for left shoulder injury.  Patient reports on 3/31 he was walking down steps (5 steps) when he tripped and fell after the first one, believes he fell down directly on left shoulder and possible on left wrist as the hand was dirty. Heard a pop within shoulder and a lot of pain here. Went to ED where x-rays were normal (a remote possible fracture tip of acromion but nothing acute). Did fall on this shoulder when he was a teenager - did not seek care - gets achy sometimes. Is left handed. Using sling since the injury. Motion has improved and shoulder feels better but afraid to move a lot because of popping and feels uncomfortable.  Past Medical History  Diagnosis Date  . MS (multiple sclerosis)   . Cluster headache   . Epilepsy     No current outpatient prescriptions on file prior to visit.   No current facility-administered medications on file prior to visit.    Past Surgical History  Procedure Laterality Date  . Upper gastrointestinal endoscopy    . Wisdom tooth extraction      No Known Allergies  History   Social History  . Marital Status: Single    Spouse Name: N/A    Number of Children: N/A  . Years of Education: N/A   Occupational History  . Not on file.   Social History Main Topics  . Smoking status: Former Games developer  . Smokeless tobacco: Not on file  . Alcohol Use: No     Comment: occasionally  . Drug Use: Yes    Special: Marijuana  . Sexually Active: Not on file   Other Topics Concern  . Not on file   Social History Narrative  . No narrative on file    Family History  Problem Relation Age of Onset  . Diabetes Mother   . Hyperlipidemia Mother   . Heart attack Neg Hx   . Hypertension Neg Hx   . Sudden death Neg Hx     BP 123/80  Pulse 80  Ht 5\' 11"  (1.803 m)  Wt 130 lb (58.968 kg)  BMI 18.14 kg/m2  Review of  Systems See HPI above.    Objective:   Physical Exam Gen: NAD  L shoulder: No swelling, ecchymoses.  No gross deformity. TTP anterior and posterior shoulder over capsule.  No AC joint tenderness, other bony tenderness. Full ER and IR.  Able to abduct and flex to 90 degrees slowly and painfully. Pain with neers.  Hawkins deferred. Negative Yergasons. Strength 5/5 resisted internal/external rotation.  Pain with empty can but good strength. Unable to get in position for apprehension. Negative sulcus. NV intact distally. Able to abduct, extend fingers and oppose thumb.  R shoulder: FROM without pain, weakness.    Assessment & Plan:  1. Left shoulder pain - consistent with shoulder subluxation with rotator cuff strain.  Start with sling regularly.  Icing, ibuprofen.  Norco as needed for severe pain.  Out of sling a couple times a day for wrist and elbow ROM.  F/u in 2 weeks for reevaluation.

## 2013-01-19 ENCOUNTER — Ambulatory Visit (INDEPENDENT_AMBULATORY_CARE_PROVIDER_SITE_OTHER): Payer: Medicare Other | Admitting: Surgery

## 2013-01-28 ENCOUNTER — Ambulatory Visit: Payer: Medicare Other | Admitting: Family Medicine

## 2013-01-28 DIAGNOSIS — Z0289 Encounter for other administrative examinations: Secondary | ICD-10-CM

## 2013-02-06 ENCOUNTER — Ambulatory Visit (INDEPENDENT_AMBULATORY_CARE_PROVIDER_SITE_OTHER): Payer: Medicare Other | Admitting: Surgery

## 2013-03-17 ENCOUNTER — Ambulatory Visit (INDEPENDENT_AMBULATORY_CARE_PROVIDER_SITE_OTHER): Payer: Medicare Other | Admitting: Surgery

## 2013-04-06 ENCOUNTER — Ambulatory Visit (INDEPENDENT_AMBULATORY_CARE_PROVIDER_SITE_OTHER): Payer: Medicare Other | Admitting: Surgery

## 2013-05-25 ENCOUNTER — Encounter (HOSPITAL_BASED_OUTPATIENT_CLINIC_OR_DEPARTMENT_OTHER): Payer: Self-pay | Admitting: *Deleted

## 2013-05-25 ENCOUNTER — Emergency Department (HOSPITAL_BASED_OUTPATIENT_CLINIC_OR_DEPARTMENT_OTHER): Payer: Medicare Other

## 2013-05-25 ENCOUNTER — Emergency Department (HOSPITAL_BASED_OUTPATIENT_CLINIC_OR_DEPARTMENT_OTHER)
Admission: EM | Admit: 2013-05-25 | Discharge: 2013-05-25 | Disposition: A | Discharge status: Home / Self Care | Payer: Medicare Other | Attending: Emergency Medicine | Admitting: Emergency Medicine

## 2013-05-25 DIAGNOSIS — Z8669 Personal history of other diseases of the nervous system and sense organs: Secondary | ICD-10-CM | POA: Insufficient documentation

## 2013-05-25 DIAGNOSIS — Z87891 Personal history of nicotine dependence: Secondary | ICD-10-CM | POA: Insufficient documentation

## 2013-05-25 DIAGNOSIS — M549 Dorsalgia, unspecified: Secondary | ICD-10-CM | POA: Insufficient documentation

## 2013-05-25 DIAGNOSIS — R110 Nausea: Secondary | ICD-10-CM | POA: Insufficient documentation

## 2013-05-25 DIAGNOSIS — R630 Anorexia: Secondary | ICD-10-CM | POA: Insufficient documentation

## 2013-05-25 DIAGNOSIS — R1084 Generalized abdominal pain: Secondary | ICD-10-CM | POA: Insufficient documentation

## 2013-05-25 DIAGNOSIS — R109 Unspecified abdominal pain: Secondary | ICD-10-CM

## 2013-05-25 DIAGNOSIS — Z9889 Other specified postprocedural states: Secondary | ICD-10-CM | POA: Insufficient documentation

## 2013-05-25 DIAGNOSIS — R5381 Other malaise: Secondary | ICD-10-CM | POA: Insufficient documentation

## 2013-05-25 DIAGNOSIS — R509 Fever, unspecified: Secondary | ICD-10-CM | POA: Insufficient documentation

## 2013-05-25 LAB — CBC WITH DIFFERENTIAL/PLATELET
Basophils Absolute: 0 10*3/uL (ref 0.0–0.1)
Basophils Relative: 0 % (ref 0–1)
Eosinophils Relative: 1 % (ref 0–5)
HCT: 44.5 % (ref 39.0–52.0)
Hemoglobin: 15.9 g/dL (ref 13.0–17.0)
MCH: 30.5 pg (ref 26.0–34.0)
MCHC: 35.7 g/dL (ref 30.0–36.0)
MCV: 85.2 fL (ref 78.0–100.0)
Monocytes Absolute: 0.5 10*3/uL (ref 0.1–1.0)
Monocytes Relative: 7 % (ref 3–12)
RDW: 11.8 % (ref 11.5–15.5)

## 2013-05-25 LAB — URINALYSIS, ROUTINE W REFLEX MICROSCOPIC
Bilirubin Urine: NEGATIVE
Glucose, UA: NEGATIVE mg/dL
Hgb urine dipstick: NEGATIVE
Ketones, ur: NEGATIVE mg/dL
Protein, ur: NEGATIVE mg/dL

## 2013-05-25 LAB — COMPREHENSIVE METABOLIC PANEL
AST: 17 U/L (ref 0–37)
Albumin: 3.7 g/dL (ref 3.5–5.2)
BUN: 15 mg/dL (ref 6–23)
CO2: 23 mEq/L (ref 19–32)
Calcium: 9.7 mg/dL (ref 8.4–10.5)
Creatinine, Ser: 0.9 mg/dL (ref 0.50–1.35)
GFR calc non Af Amer: >90 mL/min (ref >90)
Total Bilirubin: 0.6 mg/dL (ref 0.3–1.2)

## 2013-05-25 LAB — LIPASE, BLOOD: Lipase: 24 U/L (ref 11–59)

## 2013-05-25 MED ORDER — ONDANSETRON HCL 4 MG/2ML IJ SOLN
4.0000 mg | Freq: Once | INTRAMUSCULAR | Status: AC
  Administered 2013-05-25: 4 mg via INTRAVENOUS
  Filled 2013-05-25: qty 2

## 2013-05-25 MED ORDER — SODIUM CHLORIDE 0.9 % IV BOLUS (SEPSIS)
1000.0000 mL | Freq: Once | INTRAVENOUS | Status: AC
  Administered 2013-05-25: 1000 mL via INTRAVENOUS

## 2013-05-25 MED ORDER — IOHEXOL 300 MG/ML  SOLN
100.0000 mL | Freq: Once | INTRAMUSCULAR | Status: AC | PRN
  Administered 2013-05-25: 100 mL via INTRAVENOUS

## 2013-05-25 MED ORDER — POLYETHYLENE GLYCOL 3350 17 G PO PACK: 17.0000 g | PACK | Freq: Every day | ORAL | Status: DC

## 2013-05-25 MED ORDER — ONDANSETRON HCL 4 MG PO TABS
4.0000 mg | ORAL_TABLET | Freq: Four times a day (QID) | ORAL | Status: DC
Start: 2013-05-25 — End: 2013-07-20

## 2013-05-25 MED ORDER — IOHEXOL 300 MG/ML  SOLN
50.0000 mL | Freq: Once | INTRAMUSCULAR | Status: AC | PRN
  Administered 2013-05-25: 50 mL via ORAL

## 2013-05-25 MED ORDER — MORPHINE SULFATE 4 MG/ML IJ SOLN
4.0000 mg | Freq: Once | INTRAMUSCULAR | Status: AC
  Administered 2013-05-25: 4 mg via INTRAVENOUS
  Filled 2013-05-25: qty 1

## 2013-05-25 NOTE — ED Provider Notes (Signed)
CSN: [id number]     Arrival date & time 05/25/13  1942 History  This chart was scribed for Glynn Octave, MD by Ladona Ridgel Day, ED scribe. This patient was seen in room MH04/MH04 and the patient's care was started at 2039.   First MD Initiated Contact with Patient 05/25/13 2039     Chief Complaint  Patient presents with  . Abdominal Pain   The history is provided by the patient. No language interpreter was used.   HPI Comments: Jon Reeves is a 37 y.o. male who presents to the Emergency Department complaining of constant, diffuse abdominal pain, onset 3 days ago. He states was constipated today and used an enema to move his bowels which happens about once a month for him which he also attributes to his hx of MS. He states fullness feeling in his abdomen, bilateral "kidney pain", decreased appetite, fatigue, and nausea. He denies associated emesis and testicular pain. No sick contacts. He has no previous abdominal surgeries  Past Medical History  Diagnosis Date  . MS (multiple sclerosis)   . Cluster headache   . Epilepsy    Past Surgical History  Procedure Laterality Date  . Upper gastrointestinal endoscopy    . Wisdom tooth extraction     Family History  Problem Relation Age of Onset  . Diabetes Mother   . Hyperlipidemia Mother   . Heart attack Neg Hx   . Hypertension Neg Hx   . Sudden death Neg Hx    History  Substance Use Topics  . Smoking status: Former Games developer  . Smokeless tobacco: Not on file  . Alcohol Use: No     Comment: occasionally    Review of Systems  Constitutional: Positive for fever. Negative for chills.  HENT: Negative for congestion.   Respiratory: Negative for cough and shortness of breath.   Gastrointestinal: Positive for nausea and abdominal pain (diffuse abdominal pain). Negative for vomiting.  Musculoskeletal: Positive for back pain (bilateral "kidney pain").  Neurological: Negative for weakness.  All other systems reviewed and are  negative.   A complete 10 system review of systems was obtained and all systems are negative except as noted in the HPI and PMH.   Allergies  Review of patient's allergies indicates no known allergies.  Home Medications   Current Outpatient Rx  Name  Route  Sig  Dispense  Refill  . HYDROcodone-acetaminophen (NORCO) 5-325 MG per tablet   Oral   Take 1 tablet by mouth every 6 (six) hours as needed for pain.   60 tablet   0   . ondansetron (ZOFRAN) 4 MG tablet   Oral   Take 1 tablet (4 mg total) by mouth every 6 (six) hours.   12 tablet   0   . polyethylene glycol (MIRALAX) packet   Oral   Take 17 g by mouth daily.   14 each   0    Triage Vitals: BP 108/66  Pulse 76  Temp(Src) 98.2 F (36.8 C) (Oral)  Resp 20  Ht 5\' 10"  (1.778 m)  Wt 130 lb (58.968 kg)  BMI 18.65 kg/m2  SpO2 100% Physical Exam  Nursing note and vitals reviewed. Constitutional: He is oriented to person, place, and time. He appears well-developed and well-nourished. No distress.  HENT:  Head: Normocephalic and atraumatic.  Eyes: EOM are normal.  Neck: Neck supple. No tracheal deviation present.  Cardiovascular: Normal rate.   Pulmonary/Chest: Effort normal. No respiratory distress.  Abdominal: Soft. He exhibits no distension. There  is tenderness. There is no rebound and no guarding.  Periumbilical, RLQ, RUQ tender to palpation  Genitourinary:  No testicular pain, no appreciable hernias. No fecal impaction. Chaperone present.   Musculoskeletal: Normal range of motion.  Paraspinal lumbar pain but no CVA pain.   Neurological: He is alert and oriented to person, place, and time.  Skin: Skin is warm and dry.  Psychiatric: He has a normal mood and affect. His behavior is normal.    ED Course   Procedures (including critical care time) DIAGNOSTIC STUDIES: Oxygen Saturation is 100% on room air, normal by my interpretation.    COORDINATION OF CARE: At 850 PM Discussed treatment plan with patient  which includes UA. Patient agrees.   Labs Reviewed  COMPREHENSIVE METABOLIC PANEL - Abnormal; Notable for the following:    Glucose, Bld 109 (*)    All other components within normal limits  URINALYSIS, ROUTINE W REFLEX MICROSCOPIC  CBC WITH DIFFERENTIAL  LIPASE, BLOOD   Ct Abdomen Pelvis W Contrast  05/25/2013   *RADIOLOGY REPORT*  Clinical Data: Epigastric abdominal pain and right-sided flank pain.  Right lower quadrant pain.  Constipation.  CT ABDOMEN AND PELVIS WITH CONTRAST  Technique:  Multidetector CT imaging of the abdomen and pelvis was performed following the standard protocol during bolus administration of intravenous contrast.  Contrast: 50mL OMNIPAQUE IOHEXOL 300 MG/ML  SOLN, OMNIPAQUE IOHEXOL 300 MG/ML  SOLN  Comparison: 12/30/2012  Findings: Lung bases are clear.  Abdominal viscera are unremarkable.  No free air, free fluid, or lymphadenopathy.  Appendix and bowel are normal.  Bladder unremarkable.  No pelvic free fluid or lymphadenopathy.  No acute osseous abnormality.  IMPRESSION: No acute intra-abdominal or pelvic pathology.   Original Report Authenticated By: Christiana Pellant, M.D.   1. Abdominal pain     MDM  Hx  MS with 3 days of diffuse abdominal pain, worse on the R side.  No fever, vomiting, dysuria. Similar visits in the past without diagnosis.   No evidence of hernia on exam.  No urinary symptoms.  Labs unremarkable. No fecal impaction on exam.  CT scan shows normal appendix. No pain over her gallbladder. LFTs and lipase normal. With recurrent episodes of pain, but for patient to gastroenterology. Advised to take MiraLAX for the next several days. Return precautions discussed.  I personally performed the services described in this documentation, which was scribed in my presence. The recorded information has been reviewed and is accurate.    Glynn Octave, MD 05/25/13 2256

## 2013-05-25 NOTE — ED Notes (Signed)
Pt c/o abd pain and left flank pain x 3 days

## 2013-05-25 NOTE — ED Notes (Signed)
MD at bedside. 

## 2013-06-01 ENCOUNTER — Ambulatory Visit (INDEPENDENT_AMBULATORY_CARE_PROVIDER_SITE_OTHER): Payer: Medicare Other | Admitting: Surgery

## 2013-06-01 ENCOUNTER — Encounter (INDEPENDENT_AMBULATORY_CARE_PROVIDER_SITE_OTHER): Payer: Self-pay | Admitting: Surgery

## 2013-06-01 VITALS — BP 122/71 | HR 77 | Temp 98.6°F | Resp 12 | Ht 70.0 in | Wt 132.2 lb

## 2013-06-01 DIAGNOSIS — R590 Localized enlarged lymph nodes: Secondary | ICD-10-CM

## 2013-06-01 DIAGNOSIS — R599 Enlarged lymph nodes, unspecified: Secondary | ICD-10-CM

## 2013-06-01 NOTE — Progress Notes (Signed)
Patient ID: Jon Reeves, male   DOB: 1976-03-19, 37 y.o.   MRN: [id number]  Chief Complaint  Patient presents with  . Hernia    HPI Jon Reeves is a 37 y.o. male.   HPI This is a pleasant 37 year old gentleman sent by the emergency department for a possible right inguinal hernia. The patient has multiple sclerosis and chronic abdominal complaints with chronic constipation secondary to this. He has also noted an intermittent swelling in the right groin. He will occasionally have pain down into the testicle. Again this is intermittent. He has no nausea or vomiting. The pain is sharp and intermittent and moderate in intensity Past Medical History  Diagnosis Date  . MS (multiple sclerosis)   . Cluster headache   . Epilepsy     Past Surgical History  Procedure Laterality Date  . Upper gastrointestinal endoscopy    . Wisdom tooth extraction      Family History  Problem Relation Age of Onset  . Diabetes Mother   . Hyperlipidemia Mother   . Heart attack Neg Hx   . Hypertension Neg Hx   . Sudden death Neg Hx     Social History History  Substance Use Topics  . Smoking status: Former Games developer  . Smokeless tobacco: Not on file  . Alcohol Use: Yes     Comment: occasionally    No Known Allergies  Current Outpatient Prescriptions  Medication Sig Dispense Refill  . ondansetron (ZOFRAN) 4 MG tablet Take 1 tablet (4 mg total) by mouth every 6 (six) hours.  12 tablet  0   No current facility-administered medications for this visit.    Review of Systems Review of Systems  Constitutional: Negative for fever, chills and unexpected weight change.  HENT: Negative for hearing loss, congestion, sore throat, trouble swallowing and voice change.   Eyes: Negative for visual disturbance.  Respiratory: Negative for cough and wheezing.   Cardiovascular: Negative for chest pain, palpitations and leg swelling.  Gastrointestinal: Positive for abdominal pain, constipation and abdominal  distention. Negative for nausea, vomiting, diarrhea, blood in stool, anal bleeding and rectal pain.  Genitourinary: Negative for hematuria and difficulty urinating.  Musculoskeletal: Positive for arthralgias.  Skin: Negative for rash and wound.  Neurological: Negative for seizures, syncope, weakness and headaches.  Hematological: Negative for adenopathy. Does not bruise/bleed easily.  Psychiatric/Behavioral: Negative for confusion.    Blood pressure 122/71, pulse 77, temperature 98.6 F (37 C), temperature source Temporal, resp. rate 12, height 5\' 10"  (1.778 m), weight 132 lb 3.2 oz (59.966 kg).  Physical Exam Physical Exam  Constitutional: He is oriented to person, place, and time. He appears well-developed and well-nourished. No distress.  HENT:  Head: Normocephalic and atraumatic.  Right Ear: External ear normal.  Left Ear: External ear normal.  Nose: Nose normal.  Mouth/Throat: Oropharynx is clear and moist.  Eyes: Conjunctivae are normal. Pupils are equal, round, and reactive to light. Right eye exhibits no discharge. Left eye exhibits no discharge. No scleral icterus.  Neck: Normal range of motion. Neck supple. No tracheal deviation present. No thyromegaly present.  Cardiovascular: Normal rate, regular rhythm, normal heart sounds and intact distal pulses.   No murmur heard. Pulmonary/Chest: Effort normal and breath sounds normal. No respiratory distress. He has no wheezes. He has no rales.  Abdominal: Soft. Bowel sounds are normal. He exhibits no distension. There is no tenderness. There is no rebound.  There are no palpable hernias with the patient both lying and standing with vigorous  Valsalva maneuvers. He does have some slightly shoddy adenopathy in both his groins. Where he has his discomfort is below the inguinal ligament laterally and is consistent with a lymph node  Musculoskeletal: Normal range of motion. He exhibits no edema and no tenderness.  Lymphadenopathy:    He has  no cervical adenopathy.  Neurological: He is alert and oriented to person, place, and time.  Skin: Skin is warm and dry. No rash noted. He is not diaphoretic. No erythema.  Psychiatric: His behavior is normal. Judgment normal.    Data Reviewed I have reviewed the CAT scans of his abdomen and pelvis. There are no masses or hernias  Assessment    Right groin lymphadenopathy     Plan    I suspect his discomfort is from occasional lymph nodes he can still in his groin. These are currently not suspicious. Again there is no evidence of hernia. I reassured him. I will see him back as needed        Hadden Steig A 06/01/2013, 11:15 AM

## 2013-06-23 ENCOUNTER — Emergency Department (HOSPITAL_BASED_OUTPATIENT_CLINIC_OR_DEPARTMENT_OTHER)
Admission: EM | Admit: 2013-06-23 | Discharge: 2013-06-23 | Disposition: A | Discharge status: Home / Self Care | Payer: Medicare Other | Attending: Emergency Medicine | Admitting: Emergency Medicine

## 2013-06-23 ENCOUNTER — Emergency Department (HOSPITAL_BASED_OUTPATIENT_CLINIC_OR_DEPARTMENT_OTHER): Payer: Medicare Other

## 2013-06-23 ENCOUNTER — Encounter (HOSPITAL_BASED_OUTPATIENT_CLINIC_OR_DEPARTMENT_OTHER): Payer: Self-pay | Admitting: *Deleted

## 2013-06-23 DIAGNOSIS — Z8669 Personal history of other diseases of the nervous system and sense organs: Secondary | ICD-10-CM | POA: Insufficient documentation

## 2013-06-23 DIAGNOSIS — J45909 Unspecified asthma, uncomplicated: Secondary | ICD-10-CM

## 2013-06-23 DIAGNOSIS — Z87891 Personal history of nicotine dependence: Secondary | ICD-10-CM | POA: Insufficient documentation

## 2013-06-23 DIAGNOSIS — Z79899 Other long term (current) drug therapy: Secondary | ICD-10-CM | POA: Insufficient documentation

## 2013-06-23 DIAGNOSIS — J40 Bronchitis, not specified as acute or chronic: Secondary | ICD-10-CM

## 2013-06-23 DIAGNOSIS — J45901 Unspecified asthma with (acute) exacerbation: Secondary | ICD-10-CM | POA: Insufficient documentation

## 2013-06-23 DIAGNOSIS — G40909 Epilepsy, unspecified, not intractable, without status epilepticus: Secondary | ICD-10-CM | POA: Insufficient documentation

## 2013-06-23 LAB — CBC WITH DIFFERENTIAL/PLATELET
Basophils Absolute: 0 10*3/uL (ref 0.0–0.1)
Eosinophils Relative: 1 % (ref 0–5)
Lymphocytes Relative: 11 % — ABNORMAL LOW (ref 12–46)
Lymphs Abs: 1.2 10*3/uL (ref 0.7–4.0)
MCV: 86.6 fL (ref 78.0–100.0)
Neutro Abs: 8.3 10*3/uL — ABNORMAL HIGH (ref 1.7–7.7)
Platelets: 164 10*3/uL (ref 150–400)
RBC: 5.01 MIL/uL (ref 4.22–5.81)
RDW: 12.2 % (ref 11.5–15.5)
WBC: 10.2 10*3/uL (ref 4.0–10.5)

## 2013-06-23 LAB — BASIC METABOLIC PANEL
CO2: 23 mEq/L (ref 19–32)
Chloride: 102 mEq/L (ref 96–112)
Glucose, Bld: 102 mg/dL — ABNORMAL HIGH (ref 70–99)
Potassium: 3.6 mEq/L (ref 3.5–5.1)
Sodium: 136 mEq/L (ref 135–145)

## 2013-06-23 LAB — TROPONIN I: Troponin I: <0.3 ng/mL (ref <0.30)

## 2013-06-23 LAB — D-DIMER, QUANTITATIVE: D-Dimer, Quant: <0.27 ug/mL-FEU (ref 0.00–0.48)

## 2013-06-23 MED ORDER — PREDNISONE 20 MG PO TABS: 60.0000 mg | ORAL_TABLET | Freq: Every day | ORAL | Status: DC

## 2013-06-23 MED ORDER — ALBUTEROL SULFATE HFA 108 (90 BASE) MCG/ACT IN AERS: 2.0000 | INHALATION_SPRAY | RESPIRATORY_TRACT | Status: DC | PRN

## 2013-06-23 MED ORDER — ALBUTEROL SULFATE (5 MG/ML) 0.5% IN NEBU
2.5000 mg | INHALATION_SOLUTION | Freq: Once | RESPIRATORY_TRACT | Status: AC
  Administered 2013-06-23: 2.5 mg via RESPIRATORY_TRACT
  Filled 2013-06-23: qty 0.5

## 2013-06-23 MED ORDER — AMOXICILLIN 500 MG PO CAPS: 500.0000 mg | ORAL_CAPSULE | Freq: Three times a day (TID) | ORAL | Status: DC

## 2013-06-23 MED ORDER — PREDNISONE 50 MG PO TABS
60.0000 mg | ORAL_TABLET | Freq: Once | ORAL | Status: AC
  Administered 2013-06-23: 60 mg via ORAL
  Filled 2013-06-23: qty 1

## 2013-06-23 MED ORDER — BENZONATATE 100 MG PO CAPS
100.0000 mg | ORAL_CAPSULE | Freq: Three times a day (TID) | ORAL | Status: DC
Start: 2013-06-23 — End: 2013-07-20

## 2013-06-23 MED ORDER — ALBUTEROL SULFATE HFA 108 (90 BASE) MCG/ACT IN AERS
2.0000 | INHALATION_SPRAY | Freq: Once | RESPIRATORY_TRACT | Status: AC
  Administered 2013-06-23: 2 via RESPIRATORY_TRACT
  Filled 2013-06-23: qty 6.7

## 2013-06-23 NOTE — ED Notes (Signed)
MD at bedside. 

## 2013-06-23 NOTE — ED Provider Notes (Deleted)
CSN: [id number]     Arrival date & time 06/23/13  1502 History   First MD Initiated Contact with Patient 06/23/13 1515     Chief Complaint  Patient presents with  . Shortness of Breath   (Consider location/radiation/quality/duration/timing/severity/associated sxs/prior Treatment) HPI Comments: Patient presents to the ER for evaluation tightness in his chest with shortness of breath and cough which began this morning. Patient reports that he feels tightness in the left side of his chest which worsens when he tries to breathe. It has been going on all day. He does report a previous history of asthma, has not used any albuterol today.  Patient is a 37 y.o. male presenting with shortness of breath.  Shortness of Breath   Past Medical History  Diagnosis Date  . MS (multiple sclerosis)   . Cluster headache   . Epilepsy    Past Surgical History  Procedure Laterality Date  . Upper gastrointestinal endoscopy    . Wisdom tooth extraction     Family History  Problem Relation Age of Onset  . Diabetes Mother   . Hyperlipidemia Mother   . Heart attack Neg Hx   . Hypertension Neg Hx   . Sudden death Neg Hx    History  Substance Use Topics  . Smoking status: Former Games developer  . Smokeless tobacco: Not on file  . Alcohol Use: Yes     Comment: occasionally    Review of Systems  Respiratory: Positive for shortness of breath.     Allergies  Review of patient's allergies indicates no known allergies.  Home Medications   Current Outpatient Rx  Name  Route  Sig  Dispense  Refill  . gabapentin (NEURONTIN) 300 MG capsule   Oral   Take 300 mg by mouth 3 (three) times daily.         . ondansetron (ZOFRAN) 4 MG tablet   Oral   Take 1 tablet (4 mg total) by mouth every 6 (six) hours.   12 tablet   0    BP 127/84  Pulse 107  Temp(Src) 98.8 F (37.1 C) (Oral)  Resp 18  Ht 5\' 10"  (1.778 m)  Wt 130 lb (58.968 kg)  BMI 18.65 kg/m2  SpO2 100% Physical Exam  ED Course  Procedures  (including critical care time) Labs Review Labs Reviewed  CBC WITH DIFFERENTIAL - Abnormal; Notable for the following:    Neutrophils Relative % 82 (*)    Neutro Abs 8.3 (*)    Lymphocytes Relative 11 (*)    All other components within normal limits  BASIC METABOLIC PANEL - Abnormal; Notable for the following:    Glucose, Bld 102 (*)    All other components within normal limits  TROPONIN I  D-DIMER, QUANTITATIVE   Imaging Review Dg Chest 2 View  06/23/2013   *RADIOLOGY REPORT*  Clinical Data: Shortness of breath  CHEST - 2 VIEW  Comparison: 01/13/2013  Findings: The heart and pulmonary vascularity are within normal limits.  The lungs are clear bilaterally.  No acute bony abnormality is seen.  IMPRESSION: No acute abnormality noted.   Original Report Authenticated By: Alcide Clever, M.D.    MDM  Diagnosis: Asthma/bronchitis  He presented to the ER for evaluation of tightness in the chest and difficulty breathing. Cardiac workup negative. D-dimer negative the patient has very low risk for PE. Patient has improved with bronchodilator therapy. If he has a history of asthma, this is the likely cause of the patient's symptoms. We'll treat with  amoxicillin, prednisone and continue bronchodilator therapy.    Gilda Crease, MD 06/24/13 [phone]

## 2013-06-23 NOTE — ED Notes (Signed)
Pt c/o SOB x 7 hrs , hx asthma and COPD

## 2013-06-23 NOTE — ED Notes (Signed)
Patient transported to X-ray via stretcher per tech. 

## 2013-06-23 NOTE — ED Provider Notes (Addendum)
CSN: [id number]     Arrival date & time 06/23/13  1502 History   First MD Initiated Contact with Patient 06/23/13 1515     Chief Complaint  Patient presents with  . Shortness of Breath   (Consider location/radiation/quality/duration/timing/severity/associated sxs/prior Treatment) HPI  Patient presents with complaints of cough and shortness of breath. Patient does have history of asthma and feels like the symptoms are consistent with his previous asthma. Patient has not had fever.  Past Medical History  Diagnosis Date  . MS (multiple sclerosis)   . Cluster headache   . Epilepsy    Past Surgical History  Procedure Laterality Date  . Upper gastrointestinal endoscopy    . Wisdom tooth extraction     Family History  Problem Relation Age of Onset  . Diabetes Mother   . Hyperlipidemia Mother   . Heart attack Neg Hx   . Hypertension Neg Hx   . Sudden death Neg Hx    History  Substance Use Topics  . Smoking status: Former Games developer  . Smokeless tobacco: Not on file  . Alcohol Use: Yes     Comment: occasionally    Review of Systems  Respiratory: Positive for shortness of breath and wheezing.   All other systems reviewed and are negative.    Allergies  Review of patient's allergies indicates no known allergies.  Home Medications   Current Outpatient Rx  Name  Route  Sig  Dispense  Refill  . gabapentin (NEURONTIN) 300 MG capsule   Oral   Take 300 mg by mouth 3 (three) times daily.         . ondansetron (ZOFRAN) 4 MG tablet   Oral   Take 1 tablet (4 mg total) by mouth every 6 (six) hours.   12 tablet   0    BP 127/84  Pulse 107  Temp(Src) 98.8 F (37.1 C) (Oral)  Resp 18  Ht 5\' 10"  (1.778 m)  Wt 130 lb (58.968 kg)  BMI 18.65 kg/m2  SpO2 100% Physical Exam  Constitutional: He is oriented to person, place, and time. He appears well-developed and well-nourished. No distress.  HENT:  Head: Normocephalic and atraumatic.  Right Ear: Hearing normal.  Left Ear:  Hearing normal.  Nose: Nose normal.  Mouth/Throat: Oropharynx is clear and moist and mucous membranes are normal.  Eyes: Conjunctivae and EOM are normal. Pupils are equal, round, and reactive to light.  Neck: Normal range of motion. Neck supple.  Cardiovascular: Regular rhythm, S1 normal and S2 normal.  Exam reveals no gallop and no friction rub.   No murmur heard. Pulmonary/Chest: Effort normal. No respiratory distress. He has wheezes. He exhibits no tenderness.  Abdominal: Soft. Normal appearance and bowel sounds are normal. There is no hepatosplenomegaly. There is no tenderness. There is no rebound, no guarding, no tenderness at McBurney's point and negative Murphy's sign. No hernia.  Musculoskeletal: Normal range of motion.  Neurological: He is alert and oriented to person, place, and time. He has normal strength. No cranial nerve deficit or sensory deficit. Coordination normal. GCS eye subscore is 4. GCS verbal subscore is 5. GCS motor subscore is 6.  Skin: Skin is warm, dry and intact. No rash noted. No cyanosis.  Psychiatric: He has a normal mood and affect. His speech is normal and behavior is normal. Thought content normal.    ED Course  Procedures (including critical care time) Labs Review Labs Reviewed  CBC WITH DIFFERENTIAL - Abnormal; Notable for the following:  Neutrophils Relative % 82 (*)    Neutro Abs 8.3 (*)    Lymphocytes Relative 11 (*)    All other components within normal limits  BASIC METABOLIC PANEL - Abnormal; Notable for the following:    Glucose, Bld 102 (*)    All other components within normal limits  TROPONIN I  D-DIMER, QUANTITATIVE   Imaging Review Dg Chest 2 View  06/23/2013   *RADIOLOGY REPORT*  Clinical Data: Shortness of breath  CHEST - 2 VIEW  Comparison: 01/13/2013  Findings: The heart and pulmonary vascularity are within normal limits.  The lungs are clear bilaterally.  No acute bony abnormality is seen.  IMPRESSION: No acute abnormality noted.    Original Report Authenticated By: Alcide Clever, M.D.    MDM  Diagnosis: Bronchitis; asthma  Patient presents to the ER for evaluation of difficulty breathing. Patient reports previous history of asthma. Patient has been experiencing shortness of breath and cough for the course of today. Chest x-ray was clear. Patient has a negative troponin negative d-dimer. Patient is very low risk for cardiac disease and PE. Patient's symptoms are more consistent with a proctitis, secondary to recently quitting smoking. He was treated with bronchodilators and will be continued on antibiotics, corticosteroids and albuterol as an outpatient.    Gilda Crease, MD 06/23/13 1820  Gilda Crease, MD 07/05/13 [phone]

## 2013-07-20 ENCOUNTER — Emergency Department (HOSPITAL_BASED_OUTPATIENT_CLINIC_OR_DEPARTMENT_OTHER)
Admission: EM | Admit: 2013-07-20 | Discharge: 2013-07-20 | Disposition: A | Discharge status: Home / Self Care | Payer: No Typology Code available for payment source | Attending: Emergency Medicine | Admitting: Emergency Medicine

## 2013-07-20 ENCOUNTER — Encounter (HOSPITAL_BASED_OUTPATIENT_CLINIC_OR_DEPARTMENT_OTHER): Payer: Self-pay

## 2013-07-20 ENCOUNTER — Emergency Department (HOSPITAL_BASED_OUTPATIENT_CLINIC_OR_DEPARTMENT_OTHER): Payer: No Typology Code available for payment source

## 2013-07-20 DIAGNOSIS — G35 Multiple sclerosis: Secondary | ICD-10-CM | POA: Diagnosis not present

## 2013-07-20 DIAGNOSIS — Z79899 Other long term (current) drug therapy: Secondary | ICD-10-CM | POA: Diagnosis not present

## 2013-07-20 DIAGNOSIS — G40909 Epilepsy, unspecified, not intractable, without status epilepticus: Secondary | ICD-10-CM | POA: Insufficient documentation

## 2013-07-20 DIAGNOSIS — Y9389 Activity, other specified: Secondary | ICD-10-CM | POA: Insufficient documentation

## 2013-07-20 DIAGNOSIS — Z792 Long term (current) use of antibiotics: Secondary | ICD-10-CM | POA: Insufficient documentation

## 2013-07-20 DIAGNOSIS — S335XXA Sprain of ligaments of lumbar spine, initial encounter: Secondary | ICD-10-CM | POA: Diagnosis not present

## 2013-07-20 DIAGNOSIS — Y92410 Unspecified street and highway as the place of occurrence of the external cause: Secondary | ICD-10-CM | POA: Insufficient documentation

## 2013-07-20 DIAGNOSIS — F17200 Nicotine dependence, unspecified, uncomplicated: Secondary | ICD-10-CM | POA: Diagnosis not present

## 2013-07-20 DIAGNOSIS — S39012A Strain of muscle, fascia and tendon of lower back, initial encounter: Secondary | ICD-10-CM

## 2013-07-20 DIAGNOSIS — IMO0002 Reserved for concepts with insufficient information to code with codable children: Secondary | ICD-10-CM | POA: Insufficient documentation

## 2013-07-20 DIAGNOSIS — S46912A Strain of unspecified muscle, fascia and tendon at shoulder and upper arm level, left arm, initial encounter: Secondary | ICD-10-CM

## 2013-07-20 MED ORDER — IBUPROFEN 800 MG PO TABS: 800.0000 mg | ORAL_TABLET | Freq: Three times a day (TID) | ORAL | Status: DC

## 2013-07-20 MED ORDER — CYCLOBENZAPRINE HCL 10 MG PO TABS: 10.0000 mg | ORAL_TABLET | Freq: Three times a day (TID) | ORAL | Status: DC | PRN
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
## 2013-07-21 ENCOUNTER — Emergency Department (HOSPITAL_BASED_OUTPATIENT_CLINIC_OR_DEPARTMENT_OTHER): Admission: EM | Admit: 2013-07-21 | Payer: Medicare Other | Source: Home / Self Care

## 2013-07-21 ENCOUNTER — Emergency Department (HOSPITAL_COMMUNITY): Payer: No Typology Code available for payment source

## 2013-07-21 ENCOUNTER — Emergency Department (HOSPITAL_COMMUNITY)
Admission: EM | Admit: 2013-07-21 | Discharge: 2013-07-22 | Disposition: A | Discharge status: Home / Self Care | Payer: No Typology Code available for payment source | Attending: Emergency Medicine | Admitting: Emergency Medicine

## 2013-07-21 ENCOUNTER — Encounter (HOSPITAL_COMMUNITY): Payer: Self-pay | Admitting: *Deleted

## 2013-07-21 DIAGNOSIS — R32 Unspecified urinary incontinence: Secondary | ICD-10-CM | POA: Insufficient documentation

## 2013-07-21 DIAGNOSIS — F17200 Nicotine dependence, unspecified, uncomplicated: Secondary | ICD-10-CM | POA: Diagnosis not present

## 2013-07-21 DIAGNOSIS — R5381 Other malaise: Secondary | ICD-10-CM | POA: Diagnosis not present

## 2013-07-21 DIAGNOSIS — G8911 Acute pain due to trauma: Secondary | ICD-10-CM | POA: Diagnosis not present

## 2013-07-21 DIAGNOSIS — IMO0002 Reserved for concepts with insufficient information to code with codable children: Secondary | ICD-10-CM | POA: Diagnosis not present

## 2013-07-21 DIAGNOSIS — M545 Low back pain, unspecified: Secondary | ICD-10-CM | POA: Diagnosis present

## 2013-07-21 DIAGNOSIS — M5410 Radiculopathy, site unspecified: Secondary | ICD-10-CM

## 2013-07-21 DIAGNOSIS — M549 Dorsalgia, unspecified: Secondary | ICD-10-CM

## 2013-07-21 DIAGNOSIS — M5126 Other intervertebral disc displacement, lumbar region: Secondary | ICD-10-CM | POA: Diagnosis not present

## 2013-07-21 DIAGNOSIS — M79609 Pain in unspecified limb: Secondary | ICD-10-CM | POA: Diagnosis not present

## 2013-07-21 DIAGNOSIS — Z791 Long term (current) use of non-steroidal anti-inflammatories (NSAID): Secondary | ICD-10-CM | POA: Insufficient documentation

## 2013-07-21 DIAGNOSIS — M5136 Other intervertebral disc degeneration, lumbar region: Secondary | ICD-10-CM

## 2013-07-21 DIAGNOSIS — R159 Full incontinence of feces: Secondary | ICD-10-CM | POA: Diagnosis not present

## 2013-07-21 DIAGNOSIS — R209 Unspecified disturbances of skin sensation: Secondary | ICD-10-CM | POA: Diagnosis not present

## 2013-07-21 MED ORDER — PREDNISONE 50 MG PO TABS: 50.0000 mg | ORAL_TABLET | Freq: Every day | ORAL | Status: DC

## 2013-07-21 MED ORDER — OXYCODONE-ACETAMINOPHEN 5-325 MG PO TABS: 2.0000 | ORAL_TABLET | Freq: Four times a day (QID) | ORAL | Status: DC | PRN

## 2013-07-21 MED ORDER — HYDROMORPHONE HCL PF 1 MG/ML IJ SOLN
1.0000 mg | Freq: Once | INTRAMUSCULAR | Status: AC
  Administered 2013-07-21: 1 mg via INTRAMUSCULAR
  Filled 2013-07-21: qty 1

## 2013-07-21 NOTE — Discharge Instructions (Signed)
Back Injury Prevention °Back injuries can be extremely painful and difficult to heal. After having one back injury, you are much more likely to experience another later on. It is important to learn how to avoid injuring or re-injuring your back. The following tips can help you to prevent a back injury. °PHYSICAL FITNESS °· Exercise regularly and try to develop good tone in your abdominal muscles. Your abdominal muscles provide a lot of the support needed by your back. °· Do aerobic exercises (walking, jogging, biking, swimming) regularly. °· Do exercises that increase balance and strength (tai chi, yoga) regularly. This can decrease your risk of falling and injuring your back. °· Stretch before and after exercising. °· Maintain a healthy weight. The more you weigh, the more stress is placed on your back. For every pound of weight, 10 times that amount of pressure is placed on the back. °DIET °· Talk to your caregiver about how much calcium and vitamin D you need per day. These nutrients help to prevent weakening of the bones (osteoporosis). Osteoporosis can cause broken (fractured) bones that lead to back pain. °· Include good sources of calcium in your diet, such as dairy products, green, leafy vegetables, and products with calcium added (fortified). °· Include good sources of vitamin D in your diet, such as milk and foods that are fortified with vitamin D. °· Consider taking a nutritional supplement or a multivitamin if needed. °· Stop smoking if you smoke. °POSTURE °· Sit and stand up straight. Avoid leaning forward when you sit or hunching over when you stand. °· Choose chairs with good low back (lumbar) support. °· If you work at a desk, sit close to your work so you do not need to lean over. Keep your chin tucked in. Keep your neck drawn back and elbows bent at a right angle. Your arms should look like the letter "L." °· Sit high and close to the steering wheel when you drive. Add a lumbar support to your car  seat if needed. °· Avoid sitting or standing in one position for too long. Take breaks to get up, stretch, and walk around at least once every hour. Take breaks if you are driving for long periods of time. °· Sleep on your side with your knees slightly bent, or sleep on your back with a pillow under your knees. Do not sleep on your stomach. °LIFTING, TWISTING, AND REACHING °· Avoid heavy lifting, especially repetitive lifting. If you must do heavy lifting: °· Stretch before lifting. °· Work slowly. °· Rest between lifts. °· Use carts and dollies to move objects when possible. °· Make several small trips instead of carrying 1 heavy load. °· Ask for help when you need it. °· Ask for help when moving big, awkward objects. °· Follow these steps when lifting: °· Stand with your feet shoulder-width apart. °· Get as close to the object as you can. Do not try to pick up heavy objects that are far from your body. °· Use handles or lifting straps if they are available. °· Bend at your knees. Squat down, but keep your heels off the floor. °· Keep your shoulders pulled back, your chin tucked in, and your back straight. °· Lift the object slowly, tightening the muscles in your legs, abdomen, and buttocks. Keep the object as close to the center of your body as possible. °· When you put a load down, use these same guidelines in reverse. °· Do not: °· Lift the object above your waist. °·   Twist at the waist while lifting or carrying a load. Move your feet if you need to turn, not your waist.  Bend over without bending at your knees.  Avoid reaching over your head, across a table, or for an object on a high surface. OTHER TIPS  Avoid wet floors and keep sidewalks clear of ice to prevent falls.  Do not sleep on a mattress that is too soft or too hard.  Keep items that are used frequently within easy reach.  Put heavier objects on shelves at waist level and lighter objects on lower or higher shelves.  Find ways to  decrease your stress, such as exercise, massage, or relaxation techniques. Stress can build up in your muscles. Tense muscles are more vulnerable to injury.  Seek treatment for depression or anxiety if needed. These conditions can increase your risk of developing back pain. SEEK MEDICAL CARE IF:  You injure your back.  You have questions about diet, exercise, or other ways to prevent back injuries. MAKE SURE YOU:  Understand these instructions.  Will watch your condition.  Will get help right away if you are not doing well or get worse. Document Released: 11/08/2004 Document Revised: 12/24/2011 Document Reviewed: 11/12/2011 [facility] Patient Information 2014 [place], [place].  Herniated Disk The bones of your spinal column (vertebrae) protect your spinal cord and nerves that go into your arms and legs. The vertebrae are separated by disks that cushion the spinal column and put space between your vertebrae. This allows movement between the vertebrae, which allows you to bend, rotate, and move your body from side to side. Sometimes, the disks move out of place (herniate) or break open (rupture) from injury or strain. The most common area for a disk herniation is in the lower back (lumbar area). Sometimes herniation occurs in the neck (cervical) disks.  CAUSES  As we grow older, the strong, fibrous cords that connect the vertebrae and support and surround the disks (ligaments) start to weaken. A strain on the back may cause a break in the disk ligaments. RISK FACTORS Herniated disks occur most often in men who are aged 18 years to 35 years, usually after strenuous activity. Other risk factors include conditions present at birth (congenital) that affect the size of the lumbar spinal canal. Additionally, a narrowing of the areas where the nerves exit the spinal canal can occur as you age. SYMPTOMS  Symptoms of a herniated disk vary. You may have weakness in certain muscles. This weakness can  include difficulty lifting your leg or arm, difficulty standing on your toes on one side, or difficulty squeezing tightly with one of your hands. You may have numbness. You may feel a mild tingling, dull ache, or a burning or pulsating pain. In some cases, the pain is severe enough that you are unable to move. The pain most often occurs on one side of the body. The pain often starts slowly. It may get worse:  After you sit or stand.  At night.  When you sneeze, cough, or laugh.  When you bend backwards or walk more than a few yards. The pain, numbness, or weakness will often go away or improve a lot over a period of weeks to months. Herniated lumbar disk Symptoms of a herniated lumbar disk may include sharp pain in one part of your leg, hip, or buttocks and numbness in other parts. You also may feel pain or numbness on the back of your calf or the top or sole of your foot.  The same leg also may feel weak. Herniated cervical disk Symptoms of a herniated cervical disk may include pain when you move your neck, deep pain near or over your shoulder blade, or pain that moves to your upper arm, forearm, or fingers. DIAGNOSIS  To diagnose a herniated disk, your caregiver will perform a physical exam. Your caregiver also may perform diagnostic tests to see your disk or to test the reaction of your muscles and the function of your nerves. During the physical exam, your caregiver may ask you to:  Sit, stand, and walk. While you walk, your caregiver may ask you to try walking on your toes and then your heels.  Bend forward, backward, and sideways.  Raise your shoulders, elbow, wrist, and fingers and check your strength during these tasks. Your caregiver will check for:  Numbness or loss of feeling.  Muscle reflexes, which may be slower or missing.  Muscle strength, which may be weaker.  Posture or the way your spine curves. Diagnostic tests that may be done include:  A spinal X-ray exam to  rule out other causes of back pain.  Magnetic resonance imaging (MRI) or computed tomography (CT) scan, which will show if the herniated disk is pressing on your spinal canal.  Electromyography. This is sometimes used to identify the specific area of nerve involvement. TREATMENT  Initial treatment for a herniated disk is a short period of rest with medicines for pain. Pain medicines can include nonsteroidal anti-inflammatory medicines (NSAIDs), muscle relaxants for back spasms, and (rarely) narcotic pain medicine for severe pain that does not respond to NSAID use. Bed rest is often limited to 1 or 2 days at the most because prolonged rest can delay recovery. When the herniation involves the lower back, sitting should be avoided as much as possible because sitting increases pressure on the ruptured disk. Sometimes a soft neck collar will be prescribed for a few days to weeks to help support your neck in the case of a cervical herniation. Physical therapy is often prescribed for patients with disk disease. Physical therapists will teach you how to properly lift, dress, walk, and perform other activities. They will work on strengthening the muscles that help support your spine. In some cases, physical therapy alone is not enough to treat a herniated disk. Steroid injections along the involved nerve root may be needed to help control pain. The steroid is injected in the area of the herniated disk and helps by reducing swelling around the disk. Sometimes surgery is the best option to treat a herniated disk.  SEEK IMMEDIATE MEDICAL CARE IF:   You have numbness, tingling, weakness, or problems with the use of your arms or legs.  You have severe headaches that are not relieved with the use of medicines.  You notice a change in your bowel or bladder control.  You have increasing pain in any areas of your body.  You experience shortness of breath, dizziness, or fainting. MAKE SURE YOU:   Understand these  instructions.  Will watch your condition.  Will get help right away if you are not doing well or get worse. Document Released: 09/28/2000 Document Revised: 12/24/2011 Document Reviewed: 05/04/2011 [facility] Patient Information 2014 [place], [place].
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
## 2013-07-21 NOTE — ED Notes (Signed)
Pt returned from CT. Dr. Hyacinth Meeker at bedside
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
## 2013-07-21 NOTE — ED Notes (Signed)
PT remains in CT at this time, Pt's wife is now at bedside.

## 2013-07-22 NOTE — ED Provider Notes (Signed)
I saw and evaluated the patient, reviewed the resident's note and I agree with the findings and plan.  Please see my separate note regarding my evaluation of the patient.     Vida Roller, MD 07/22/13 [phone]

## 2013-08-14 ENCOUNTER — Encounter: Payer: Self-pay | Admitting: Family Medicine

## 2013-08-14 ENCOUNTER — Ambulatory Visit (INDEPENDENT_AMBULATORY_CARE_PROVIDER_SITE_OTHER): Payer: Medicare Other | Admitting: Family Medicine

## 2013-08-14 VITALS — BP 106/69 | HR 88 | Ht 71.0 in | Wt 135.0 lb

## 2013-08-14 DIAGNOSIS — M542 Cervicalgia: Secondary | ICD-10-CM

## 2013-08-14 DIAGNOSIS — M549 Dorsalgia, unspecified: Secondary | ICD-10-CM

## 2013-08-14 MED ORDER — PREDNISONE (PAK) 10 MG PO TABS: ORAL_TABLET | ORAL | Status: DC

## 2013-08-14 NOTE — Patient Instructions (Signed)
We will go ahead with an MRI of your cervical spine. Take prednisone as directed for 12 day course. I will call you the business day following the MRI to go over results. Will consider physical therapy vs possible neurosurgery referral (latter if there's a large herniation) depending on those results.

## 2013-08-15 ENCOUNTER — Ambulatory Visit (HOSPITAL_BASED_OUTPATIENT_CLINIC_OR_DEPARTMENT_OTHER)
Admission: RE | Admit: 2013-08-15 | Discharge: 2013-08-15 | Disposition: A | Discharge status: Home / Self Care | Payer: No Typology Code available for payment source | Source: Ambulatory Visit | Attending: Family Medicine | Admitting: Family Medicine

## 2013-08-15 DIAGNOSIS — R29898 Other symptoms and signs involving the musculoskeletal system: Secondary | ICD-10-CM | POA: Insufficient documentation

## 2013-08-15 DIAGNOSIS — M47812 Spondylosis without myelopathy or radiculopathy, cervical region: Secondary | ICD-10-CM | POA: Diagnosis not present

## 2013-08-15 DIAGNOSIS — M542 Cervicalgia: Secondary | ICD-10-CM | POA: Insufficient documentation

## 2013-08-18 ENCOUNTER — Other Ambulatory Visit: Payer: Self-pay | Admitting: *Deleted

## 2013-08-18 ENCOUNTER — Encounter: Payer: Self-pay | Admitting: Family Medicine

## 2013-08-18 DIAGNOSIS — M549 Dorsalgia, unspecified: Secondary | ICD-10-CM

## 2013-08-18 DIAGNOSIS — M542 Cervicalgia: Secondary | ICD-10-CM | POA: Insufficient documentation
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
## 2013-08-19 ENCOUNTER — Ambulatory Visit: Payer: Medicare Other | Admitting: Rehabilitation

## 2013-08-20 ENCOUNTER — Ambulatory Visit: Payer: Medicare Other | Attending: Family Medicine | Admitting: Rehabilitation

## 2013-08-20 DIAGNOSIS — M2550 Pain in unspecified joint: Secondary | ICD-10-CM | POA: Insufficient documentation

## 2013-08-20 DIAGNOSIS — IMO0001 Reserved for inherently not codable concepts without codable children: Secondary | ICD-10-CM | POA: Insufficient documentation

## 2013-08-26 ENCOUNTER — Ambulatory Visit: Payer: Medicare Other | Admitting: Rehabilitation

## 2013-08-28 ENCOUNTER — Ambulatory Visit: Payer: Medicare Other | Admitting: Rehabilitation

## 2014-01-03 ENCOUNTER — Encounter (HOSPITAL_BASED_OUTPATIENT_CLINIC_OR_DEPARTMENT_OTHER): Payer: Self-pay | Admitting: Emergency Medicine

## 2014-01-03 ENCOUNTER — Emergency Department (HOSPITAL_BASED_OUTPATIENT_CLINIC_OR_DEPARTMENT_OTHER)
Admission: EM | Admit: 2014-01-03 | Discharge: 2014-01-04 | Disposition: A | Discharge status: Home / Self Care | Payer: Medicare Other | Attending: Emergency Medicine | Admitting: Emergency Medicine

## 2014-01-03 DIAGNOSIS — IMO0002 Reserved for concepts with insufficient information to code with codable children: Secondary | ICD-10-CM | POA: Insufficient documentation

## 2014-01-03 DIAGNOSIS — L0201 Cutaneous abscess of face: Secondary | ICD-10-CM | POA: Insufficient documentation

## 2014-01-03 DIAGNOSIS — Z79899 Other long term (current) drug therapy: Secondary | ICD-10-CM | POA: Insufficient documentation

## 2014-01-03 DIAGNOSIS — G40909 Epilepsy, unspecified, not intractable, without status epilepticus: Secondary | ICD-10-CM | POA: Insufficient documentation

## 2014-01-03 DIAGNOSIS — Z87891 Personal history of nicotine dependence: Secondary | ICD-10-CM | POA: Insufficient documentation

## 2014-01-03 DIAGNOSIS — Z791 Long term (current) use of non-steroidal anti-inflammatories (NSAID): Secondary | ICD-10-CM | POA: Insufficient documentation

## 2014-01-03 DIAGNOSIS — L03211 Cellulitis of face: Principal | ICD-10-CM | POA: Insufficient documentation

## 2014-01-03 HISTORY — DX: Trigeminal neuralgia: G50.0

## 2014-01-03 NOTE — ED Notes (Addendum)
abscess left side of face causing occassional sharp shooting pain doe that side of face. 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

## 2014-01-03 NOTE — ED Notes (Signed)
Red bump to face x 1 day

## 2014-01-04 MED ORDER — DOXYCYCLINE HYCLATE 100 MG PO CAPS: 100.0000 mg | ORAL_CAPSULE | Freq: Two times a day (BID) | ORAL | Status: DC

## 2014-01-04 MED ORDER — DOXYCYCLINE HYCLATE 100 MG PO TABS
100.0000 mg | ORAL_TABLET | Freq: Once | ORAL | Status: AC
Start: 2014-01-04 — End: 2014-01-04
  Administered 2014-01-04: 100 mg via ORAL
  Filled 2014-01-04: qty 1

## 2014-01-04 NOTE — Discharge Instructions (Signed)
Abscess An abscess is an infected area that contains a collection of pus and debris.It can occur in almost any part of the body. An abscess is also known as a furuncle or boil. CAUSES  An abscess occurs when tissue gets infected. This can occur from blockage of oil or sweat glands, infection of hair follicles, or a minor injury to the skin. As the body tries to fight the infection, pus collects in the area and creates pressure under the skin. This pressure causes pain. People with weakened immune systems have difficulty fighting infections and get certain abscesses more often.  SYMPTOMS Usually an abscess develops on the skin and becomes a painful mass that is red, warm, and tender. If the abscess forms under the skin, you may feel a moveable soft area under the skin. Some abscesses break open (rupture) on their own, but most will continue to get worse without care. The infection can spread deeper into the body and eventually into the bloodstream, causing you to feel ill.  DIAGNOSIS  Your caregiver will take your medical history and perform a physical exam. A sample of fluid may also be taken from the abscess to determine what is causing your infection. TREATMENT  Your caregiver may prescribe antibiotic medicines to fight the infection. However, taking antibiotics alone usually does not cure an abscess. Your caregiver may need to make a small cut (incision) in the abscess to drain the pus. In some cases, gauze is packed into the abscess to reduce pain and to continue draining the area. HOME CARE INSTRUCTIONS   Only take over-the-counter or prescription medicines for pain, discomfort, or fever as directed by your caregiver.  If you were prescribed antibiotics, take them as directed. Finish them even if you start to feel better.  If gauze is used, follow your caregiver's directions for changing the gauze.  To avoid spreading the infection:  Keep your draining abscess covered with a  bandage.  Wash your hands well.  Do not share personal care items, towels, or whirlpools with others.  Avoid skin contact with others.  Keep your skin and clothes clean around the abscess.  Keep all follow-up appointments as directed by your caregiver. SEEK MEDICAL CARE IF:   You have increased pain, swelling, redness, fluid drainage, or bleeding.  You have muscle aches, chills, or a general ill feeling.  You have a fever. MAKE SURE YOU:   Understand these instructions.  Will watch your condition.  Will get help right away if you are not doing well or get worse. Document Released: 07/11/2005 Document Revised: 04/01/2012 Document Reviewed: 12/14/2011 [employer] Patient Information 2014 [employer].  

## 2014-01-04 NOTE — ED Provider Notes (Signed)
CSN: [id number]     Arrival date & time 01/03/14  2049 History  This chart was scribed for Jon Seamen, MD by Blanchard Kelch, ED Scribe. The patient was seen in room MH05/MH05. Patient's care was started at 12:34 AM.    Chief Complaint  Patient presents with  . Abscess     Patient is a 38 y.o. male presenting with abscess. The history is provided by the patient. No language interpreter was used.  Abscess   HPI Comments: DEMONT LINFORD is a 38 y.o. male who presents to the Emergency Department complaining of a tender, swollen, erythematous mass to his left upper cheek that appeared today. He characterizes the pain as pressure. He denies alleviating factors.  Past Medical History  Diagnosis Date  . MS (multiple sclerosis)   . Cluster headache   . Epilepsy   . Trigeminal neuralgia    Past Surgical History  Procedure Laterality Date  . Upper gastrointestinal endoscopy    . Wisdom tooth extraction     Family History  Problem Relation Age of Onset  . Diabetes Mother   . Hyperlipidemia Mother   . Heart attack Neg Hx   . Hypertension Neg Hx   . Sudden death Neg Hx    History  Substance Use Topics  . Smoking status: Former Smoker    Types: Cigarettes  . Smokeless tobacco: Never Used  . Alcohol Use: 0.6 oz/week    1 Cans of beer per week     Comment: occasionally    Review of Systems A complete 10 system review of systems was obtained and all systems are negative except as noted in the HPI and PMH.     Allergies  Adhesive  Home Medications   Current Outpatient Rx  Name  Route  Sig  Dispense  Refill  . albuterol (PROVENTIL HFA;VENTOLIN HFA) 108 (90 BASE) MCG/ACT inhaler   Inhalation   Inhale 2 puffs into the lungs daily as needed for wheezing or shortness of breath.         . gabapentin (NEURONTIN) 100 MG capsule   Oral   Take 100 mg by mouth 4 (four) times daily.         . Vitamin D, Ergocalciferol, (DRISDOL) 50000 UNITS CAPS capsule   Oral   Take  50,000 Units by mouth every 7 (seven) days. On Wednesday         . cyclobenzaprine (FLEXERIL) 10 MG tablet   Oral   Take 1 tablet (10 mg total) by mouth 3 (three) times daily as needed for muscle spasms.   30 tablet   0   . doxycycline (VIBRAMYCIN) 100 MG capsule   Oral   Take 1 capsule (100 mg total) by mouth 2 (two) times daily. One po bid x 7 days   14 capsule   0   . ibuprofen (ADVIL,MOTRIN) 800 MG tablet   Oral   Take 1 tablet (800 mg total) by mouth 3 (three) times daily.   21 tablet   0   . oxyCODONE-acetaminophen (PERCOCET/ROXICET) 5-325 MG per tablet   Oral   Take 2 tablets by mouth every 6 (six) hours as needed for pain.   15 tablet   0   . predniSONE (STERAPRED UNI-PAK) 10 MG tablet      6 tabs po days 1-2, 5 tabs po days 3-4, 4 tabs po days 5-6, 3 tabs po days 7-8, 2 tabs po days 9-10, 1 tab po days 11-12   42  tablet   0    Triage Vitals: BP 117/79  Pulse 80  Temp(Src) 98.5 F (36.9 C) (Oral)  Resp 20  Ht 5\' 10"  (1.778 m)  Wt 135 lb (61.236 kg)  BMI 19.37 kg/m2  SpO2 100%  Physical Exam  Nursing note and vitals reviewed. General: Well-developed, well-nourished male in no acute distress; appearance consistent with age of record HENT: normocephalic; atraumatic Eyes: pupils equal, round and reactive to light; extraocular muscles intact Neck: supple Heart: regular rate and rhythm; no murmurs, rubs or gallops Lungs: clear to auscultation bilaterally Abdomen: soft; nondistended; nontender; no masses or hepatosplenomegaly; bowel sounds present Extremities: No deformity; full range of motion; pulses normal Neurologic: Awake, alert and oriented; motor function intact in all extremities and symmetric; no facial droop Skin: Warm and dry; tender, erythematous mass of his left zygomatic region. I do not feel any fluctuance; do not appreciate pus pocket on bedside doppler US Psychiatric: Normal mood and affect   ED Course  Procedures (including critical care  time)  DIAGNOSTIC STUDIES: Oxygen Saturation is 100% on room air, normal by my interpretation.    COORDINATION OF CARE: 12:36 AM -Early abscess to left zygomatic region. I&D not indicated at this time based on bedside US findings. Will discharge with prescription for antibiotics. Plan to follow up in two to three days for worsening or persistent symptoms. Patient verbalizes understanding and agrees with treatment plan.    MDM   Final diagnoses:  Abscess of face    I personally performed the services described in this documentation, which was scribed in my presence. The recorded information has been reviewed and is accurate.     Jon SeamenJohn L Fumiko Cham, MD 01/04/14 [phone]

## 2014-02-10 ENCOUNTER — Emergency Department (HOSPITAL_BASED_OUTPATIENT_CLINIC_OR_DEPARTMENT_OTHER)
Admission: EM | Admit: 2014-02-10 | Discharge: 2014-02-10 | Disposition: A | Discharge status: Home / Self Care | Payer: Medicare Other | Attending: Emergency Medicine | Admitting: Emergency Medicine

## 2014-02-10 ENCOUNTER — Emergency Department (HOSPITAL_BASED_OUTPATIENT_CLINIC_OR_DEPARTMENT_OTHER): Payer: Medicare Other

## 2014-02-10 ENCOUNTER — Encounter (HOSPITAL_BASED_OUTPATIENT_CLINIC_OR_DEPARTMENT_OTHER): Payer: Self-pay | Admitting: Emergency Medicine

## 2014-02-10 DIAGNOSIS — G40909 Epilepsy, unspecified, not intractable, without status epilepticus: Secondary | ICD-10-CM | POA: Insufficient documentation

## 2014-02-10 DIAGNOSIS — R079 Chest pain, unspecified: Secondary | ICD-10-CM | POA: Insufficient documentation

## 2014-02-10 DIAGNOSIS — Z79899 Other long term (current) drug therapy: Secondary | ICD-10-CM | POA: Insufficient documentation

## 2014-02-10 DIAGNOSIS — Z791 Long term (current) use of non-steroidal anti-inflammatories (NSAID): Secondary | ICD-10-CM | POA: Insufficient documentation

## 2014-02-10 DIAGNOSIS — F411 Generalized anxiety disorder: Secondary | ICD-10-CM | POA: Insufficient documentation

## 2014-02-10 DIAGNOSIS — M549 Dorsalgia, unspecified: Secondary | ICD-10-CM | POA: Insufficient documentation

## 2014-02-10 DIAGNOSIS — G35 Multiple sclerosis: Secondary | ICD-10-CM | POA: Insufficient documentation

## 2014-02-10 DIAGNOSIS — Z87891 Personal history of nicotine dependence: Secondary | ICD-10-CM | POA: Insufficient documentation

## 2014-02-10 DIAGNOSIS — G500 Trigeminal neuralgia: Secondary | ICD-10-CM | POA: Insufficient documentation

## 2014-02-10 DIAGNOSIS — Z792 Long term (current) use of antibiotics: Secondary | ICD-10-CM | POA: Insufficient documentation

## 2014-02-10 DIAGNOSIS — G44009 Cluster headache syndrome, unspecified, not intractable: Secondary | ICD-10-CM | POA: Insufficient documentation

## 2014-02-10 LAB — BASIC METABOLIC PANEL
BUN: 20 mg/dL (ref 6–23)
CHLORIDE: 102 meq/L (ref 96–112)
CO2: 26 mEq/L (ref 19–32)
CREATININE: 1 mg/dL (ref 0.50–1.35)
Calcium: 9.5 mg/dL (ref 8.4–10.5)
GFR calc Af Amer: >90 mL/min (ref >90)
GFR calc non Af Amer: >90 mL/min (ref >90)
GLUCOSE: 108 mg/dL — AB (ref 70–99)
Potassium: 4 mEq/L (ref 3.7–5.3)
Sodium: 140 mEq/L (ref 137–147)

## 2014-02-10 LAB — CBC WITH DIFFERENTIAL/PLATELET
BASOS ABS: 0 10*3/uL (ref 0.0–0.1)
Basophils Relative: 0 % (ref 0–1)
EOS PCT: 1 % (ref 0–5)
Eosinophils Absolute: 0.1 10*3/uL (ref 0.0–0.7)
HEMATOCRIT: 43 % (ref 39.0–52.0)
HEMOGLOBIN: 15.6 g/dL (ref 13.0–17.0)
LYMPHS PCT: 43 % (ref 12–46)
Lymphs Abs: 3.3 10*3/uL (ref 0.7–4.0)
MCH: 31.3 pg (ref 26.0–34.0)
MCHC: 36.3 g/dL — ABNORMAL HIGH (ref 30.0–36.0)
MCV: 86.2 fL (ref 78.0–100.0)
MONOS PCT: 8 % (ref 3–12)
Monocytes Absolute: 0.6 10*3/uL (ref 0.1–1.0)
Neutro Abs: 3.6 10*3/uL (ref 1.7–7.7)
Neutrophils Relative %: 47 % (ref 43–77)
Platelets: 187 10*3/uL (ref 150–400)
RBC: 4.99 MIL/uL (ref 4.22–5.81)
RDW: 12.3 % (ref 11.5–15.5)
WBC: 7.6 10*3/uL (ref 4.0–10.5)

## 2014-02-10 LAB — TROPONIN I

## 2014-02-10 MED ORDER — ASPIRIN 81 MG PO CHEW
324.0000 mg | CHEWABLE_TABLET | Freq: Once | ORAL | Status: AC
Start: 2014-02-10 — End: 2014-02-10
  Administered 2014-02-10: 324 mg via ORAL
  Filled 2014-02-10: qty 4

## 2014-02-10 MED ORDER — KETOROLAC TROMETHAMINE 30 MG/ML IJ SOLN
30.0000 mg | Freq: Once | INTRAMUSCULAR | Status: AC
  Administered 2014-02-10: 30 mg via INTRAVENOUS
  Filled 2014-02-10: qty 1

## 2014-02-10 NOTE — ED Notes (Signed)
C/o CP that started approx 5pm today-reports anxiety earlier today

## 2014-02-10 NOTE — Discharge Instructions (Signed)
Ibuprofen 600 mg every 6 hours as needed for pain.   Return to the emergency department if you develop worsening pain, and shortness of breath, or if you develop other new and concerning symptoms.   Chest Pain (Nonspecific) It is often hard to give a specific diagnosis for the cause of chest pain. There is always a chance that your pain could be related to something serious, such as a heart attack or a blood clot in the lungs. You need to follow up with your caregiver for further evaluation. CAUSES   Heartburn.  Pneumonia or bronchitis.  Anxiety or stress.  Inflammation around your heart (pericarditis) or lung (pleuritis or pleurisy).  A blood clot in the lung.  A collapsed lung (pneumothorax). It can develop suddenly on its own (spontaneous pneumothorax) or from injury (trauma) to the chest.  Shingles infection (herpes zoster virus). The chest wall is composed of bones, muscles, and cartilage. Any of these can be the source of the pain.  The bones can be bruised by injury.  The muscles or cartilage can be strained by coughing or overwork.  The cartilage can be affected by inflammation and become sore (costochondritis). DIAGNOSIS  Lab tests or other studies, such as X-rays, electrocardiography, stress testing, or cardiac imaging, may be needed to find the cause of your pain.  TREATMENT   Treatment depends on what may be causing your chest pain. Treatment may include:  Acid blockers for heartburn.  Anti-inflammatory medicine.  Pain medicine for inflammatory conditions.  Antibiotics if an infection is present.  You may be advised to change lifestyle habits. This includes stopping smoking and avoiding alcohol, caffeine, and chocolate.  You may be advised to keep your head raised (elevated) when sleeping. This reduces the chance of acid going backward from your stomach into your esophagus.  Most of the time, nonspecific chest pain will improve within 2 to 3 days with rest  and mild pain medicine. HOME CARE INSTRUCTIONS   If antibiotics were prescribed, take your antibiotics as directed. Finish them even if you start to feel better.  For the next few days, avoid physical activities that bring on chest pain. Continue physical activities as directed.  Do not smoke.  Avoid drinking alcohol.  Only take over-the-counter or prescription medicine for pain, discomfort, or fever as directed by your caregiver.  Follow your caregiver's suggestions for further testing if your chest pain does not go away.  Keep any follow-up appointments you made. If you do not go to an appointment, you could develop lasting (chronic) problems with pain. If there is any problem keeping an appointment, you must call to reschedule. SEEK MEDICAL CARE IF:   You think you are having problems from the medicine you are taking. Read your medicine instructions carefully.  Your chest pain does not go away, even after treatment.  You develop a rash with blisters on your chest. SEEK IMMEDIATE MEDICAL CARE IF:   You have increased chest pain or pain that spreads to your arm, neck, jaw, back, or abdomen.  You develop shortness of breath, an increasing cough, or you are coughing up blood.  You have severe back or abdominal pain, feel nauseous, or vomit.  You develop severe weakness, fainting, or chills.  You have a fever. THIS IS AN EMERGENCY. Do not wait to see if the pain will go away. Get medical help at once. Call your local emergency services (911 in [place] Do not drive yourself to the hospital. MAKE SURE YOU:  Understand these instructions.  Will watch your condition.  Will get help right away if you are not doing well or get worse. Document Released: 07/11/2005 Document Revised: 12/24/2011 Document Reviewed: 05/06/2008 [facility] Patient Information 2014 [place].

## 2014-02-10 NOTE — ED Provider Notes (Signed)
CSN: [id number]     Arrival date & time 02/10/14  2202 History  This chart was scribed for Jon Lyons, MD by Luisa Dago, ED Scribe. This patient was seen in room MH02/MH02 and the patient's care was started at 10:40 PM.    Chief Complaint  Patient presents with  . Chest Pain   The history is provided by the patient. No language interpreter was used.   HPI Comments: Jon Reeves is a 38 y.o. male who presents to the Emergency Department complaining of chest pain that started at 5 pm today. Pt states that he was watching TV when he had an anxiety attack, which he was able to control. He states that as the day progressed he started experiencing chest pain. He describes the discomfort as "the after effect of a punch". Pt states that the pain is worsened by deep breathing. He is also complaining of associated back pain and headache. Pt has a history of MS.  He states that when he was younger he was tachycardic but was never officially treated for it. He denies any DM, fever, chills, or abdominal pain. Denies any history of heart problems. Pt denies smoking (he quit 6 months ago).   Pt states that he walks a lot. But does not experience any heart problem associated with exertion.   Past Medical History  Diagnosis Date  . MS (multiple sclerosis)   . Cluster headache   . Epilepsy   . Trigeminal neuralgia    Past Surgical History  Procedure Laterality Date  . Upper gastrointestinal endoscopy    . Wisdom tooth extraction     Family History  Problem Relation Age of Onset  . Diabetes Mother   . Hyperlipidemia Mother   . Heart attack Neg Hx   . Hypertension Neg Hx   . Sudden death Neg Hx    History  Substance Use Topics  . Smoking status: Former Smoker    Types: Cigarettes  . Smokeless tobacco: Never Used  . Alcohol Use: Yes     Comment: occasionally    Review of Systems A complete 10 system review of systems was obtained and all systems are negative except as noted in the HPI  and PMH.     Allergies  Adhesive  Home Medications   Prior to Admission medications   Medication Sig Start Date End Date Taking? Authorizing Provider  albuterol (PROVENTIL HFA;VENTOLIN HFA) 108 (90 BASE) MCG/ACT inhaler Inhale 2 puffs into the lungs daily as needed for wheezing or shortness of breath.    Historical Provider, MD  cyclobenzaprine (FLEXERIL) 10 MG tablet Take 1 tablet (10 mg total) by mouth 3 (three) times daily as needed for muscle spasms. 07/20/13   Charles B. Bernette Mayers, MD  doxycycline (VIBRAMYCIN) 100 MG capsule Take 1 capsule (100 mg total) by mouth 2 (two) times daily. One po bid x 7 days 01/04/14   Carlisle Beers Molpus, MD  gabapentin (NEURONTIN) 100 MG capsule Take 100 mg by mouth 4 (four) times daily.    Historical Provider, MD  ibuprofen (ADVIL,MOTRIN) 800 MG tablet Take 1 tablet (800 mg total) by mouth 3 (three) times daily. 07/20/13   Charles B. Bernette Mayers, MD  oxyCODONE-acetaminophen (PERCOCET/ROXICET) 5-325 MG per tablet Take 2 tablets by mouth every 6 (six) hours as needed for pain. 07/21/13   Dorna Leitz, MD  predniSONE (STERAPRED UNI-PAK) 10 MG tablet 6 tabs po days 1-2, 5 tabs po days 3-4, 4 tabs po days 5-6, 3 tabs po days 7-8,  2 tabs po days 9-10, 1 tab po days 11-12 08/14/13   Lenda KelpShane R Hudnall, MD  Vitamin D, Ergocalciferol, (DRISDOL) 50000 UNITS CAPS capsule Take 50,000 Units by mouth every 7 (seven) days. On Wednesday    Historical Provider, MD   BP 121/79  Pulse 82  Temp(Src) 98 F (36.7 C) (Oral)  Resp 20  Ht 5\' 10"  (1.778 m)  Wt 135 lb (61.236 kg)  BMI 19.37 kg/m2  SpO2 100%  Physical Exam  Nursing note and vitals reviewed. Constitutional: He is oriented to person, place, and time. He appears well-developed and well-nourished. No distress.  HENT:  Head: Normocephalic and atraumatic.  Right Ear: External ear normal.  Left Ear: External ear normal.  Nose: Nose normal.  Mouth/Throat: Oropharynx is clear and moist. No oropharyngeal exudate.  Eyes:  Conjunctivae and EOM are normal. Pupils are equal, round, and reactive to light. Right eye exhibits no discharge. Left eye exhibits no discharge.  Neck: Normal range of motion. Neck supple. No thyromegaly present.  Cardiovascular: Normal rate, regular rhythm and normal heart sounds.  Exam reveals no gallop and no friction rub.   No murmur heard. Pulmonary/Chest: Effort normal and breath sounds normal. No respiratory distress.  Abdominal: Soft. Bowel sounds are normal. He exhibits no distension and no mass. There is no tenderness. There is no rebound and no guarding.  Musculoskeletal: He exhibits no edema and no tenderness.  Lymphadenopathy:    He has no cervical adenopathy.  Neurological: He is alert and oriented to person, place, and time. He has normal reflexes. No cranial nerve deficit. Coordination normal.  Skin: Skin is warm and dry.  Psychiatric: He has a normal mood and affect. His behavior is normal. Thought content normal.    ED Course  Procedures (including critical care time)  DIAGNOSTIC STUDIES: Oxygen Saturation is 100% on RA, normal by my interpretation.    COORDINATION OF CARE: 10:47 PM- Pt advised of plan for treatment and pt agrees.  Labs Review Labs Reviewed  CBC WITH DIFFERENTIAL - Abnormal; Notable for the following:    MCHC 36.3 (*)    All other components within normal limits  BASIC METABOLIC PANEL  TROPONIN I    Imaging Review No results found.   EKG Interpretation   Date/Time:  Wednesday February 10 2014 22:12:13 EDT Ventricular Rate:  72 PR Interval:  144 QRS Duration: 104 QT Interval:  368 QTC Calculation: 402 R Axis:   84 Text Interpretation:  Normal sinus rhythm Normal ECG Confirmed by DELOS   MD, Cohen Boettner ([id number]) on 02/10/2014 11:17:54 PM      MDM   Final diagnoses:  None    Patient is a 38 year old male with history of MS, but no prior cardiac history. Presents today with complaints of tightness in his chest and feeling anxious that has  been going on since this morning. He denies any shortness of breath, nausea, diaphoresis, or radiation to the arm or jaw. He denies any recent exertional symptoms and has no cardiac risk factors.  Workup reveals an EKG showing early repolarization, however no other acute abnormality. His troponin is negative with persistent symptoms since this morning. Chest x-ray reveals no acute cardiopulmonary process. I doubt a cardiac etiology and the patient appears quite comfortable. He was given Toradol with some relief. At this point I feel as though he is appropriate for discharge. I will advise anti-inflammatories, rest, and when necessary return if his symptoms worsen or change.  I personally performed the services described in  this documentation, which was scribed in my presence. The recorded information has been reviewed and is accurate.      Jon Lyons, MD 02/10/14 2340

## 2014-02-14 ENCOUNTER — Encounter (HOSPITAL_BASED_OUTPATIENT_CLINIC_OR_DEPARTMENT_OTHER): Payer: Self-pay | Admitting: Emergency Medicine

## 2014-02-14 ENCOUNTER — Emergency Department (HOSPITAL_BASED_OUTPATIENT_CLINIC_OR_DEPARTMENT_OTHER)
Admission: EM | Admit: 2014-02-14 | Discharge: 2014-02-14 | Disposition: A | Discharge status: Home / Self Care | Payer: Medicare Other | Attending: Emergency Medicine | Admitting: Emergency Medicine

## 2014-02-14 ENCOUNTER — Emergency Department (HOSPITAL_BASED_OUTPATIENT_CLINIC_OR_DEPARTMENT_OTHER): Payer: Medicare Other

## 2014-02-14 DIAGNOSIS — G40909 Epilepsy, unspecified, not intractable, without status epilepticus: Secondary | ICD-10-CM | POA: Insufficient documentation

## 2014-02-14 DIAGNOSIS — J45901 Unspecified asthma with (acute) exacerbation: Secondary | ICD-10-CM | POA: Insufficient documentation

## 2014-02-14 DIAGNOSIS — Z87891 Personal history of nicotine dependence: Secondary | ICD-10-CM | POA: Insufficient documentation

## 2014-02-14 DIAGNOSIS — G500 Trigeminal neuralgia: Secondary | ICD-10-CM | POA: Insufficient documentation

## 2014-02-14 DIAGNOSIS — Z79899 Other long term (current) drug therapy: Secondary | ICD-10-CM | POA: Insufficient documentation

## 2014-02-14 DIAGNOSIS — G35 Multiple sclerosis: Secondary | ICD-10-CM | POA: Insufficient documentation

## 2014-02-14 DIAGNOSIS — Z791 Long term (current) use of non-steroidal anti-inflammatories (NSAID): Secondary | ICD-10-CM | POA: Insufficient documentation

## 2014-02-14 DIAGNOSIS — Z792 Long term (current) use of antibiotics: Secondary | ICD-10-CM | POA: Insufficient documentation

## 2014-02-14 DIAGNOSIS — G44009 Cluster headache syndrome, unspecified, not intractable: Secondary | ICD-10-CM | POA: Insufficient documentation

## 2014-02-14 LAB — CBC WITH DIFFERENTIAL/PLATELET
Basophils Absolute: 0 10*3/uL (ref 0.0–0.1)
Basophils Relative: 0 % (ref 0–1)
EOS PCT: 2 % (ref 0–5)
Eosinophils Absolute: 0.1 10*3/uL (ref 0.0–0.7)
HCT: 44.6 % (ref 39.0–52.0)
HEMOGLOBIN: 16 g/dL (ref 13.0–17.0)
LYMPHS PCT: 20 % (ref 12–46)
Lymphs Abs: 1.6 10*3/uL (ref 0.7–4.0)
MCH: 30.9 pg (ref 26.0–34.0)
MCHC: 35.9 g/dL (ref 30.0–36.0)
MCV: 86.3 fL (ref 78.0–100.0)
MONO ABS: 0.8 10*3/uL (ref 0.1–1.0)
MONOS PCT: 10 % (ref 3–12)
NEUTROS ABS: 5.5 10*3/uL (ref 1.7–7.7)
Neutrophils Relative %: 69 % (ref 43–77)
Platelets: 179 10*3/uL (ref 150–400)
RBC: 5.17 MIL/uL (ref 4.22–5.81)
RDW: 12.4 % (ref 11.5–15.5)
WBC: 8 10*3/uL (ref 4.0–10.5)

## 2014-02-14 LAB — BASIC METABOLIC PANEL
BUN: 17 mg/dL (ref 6–23)
CALCIUM: 9.5 mg/dL (ref 8.4–10.5)
CHLORIDE: 101 meq/L (ref 96–112)
CO2: 23 meq/L (ref 19–32)
CREATININE: 1 mg/dL (ref 0.50–1.35)
GFR calc Af Amer: >90 mL/min (ref >90)
GFR calc non Af Amer: >90 mL/min (ref >90)
Glucose, Bld: 123 mg/dL — ABNORMAL HIGH (ref 70–99)
Potassium: 3.8 mEq/L (ref 3.7–5.3)
Sodium: 140 mEq/L (ref 137–147)

## 2014-02-14 LAB — RAPID URINE DRUG SCREEN, HOSP PERFORMED
Amphetamines: NOT DETECTED
BARBITURATES: NOT DETECTED
BENZODIAZEPINES: NOT DETECTED
COCAINE: NOT DETECTED
Opiates: NOT DETECTED
TETRAHYDROCANNABINOL: POSITIVE — AB

## 2014-02-14 LAB — D-DIMER, QUANTITATIVE: D-Dimer, Quant: <0.27 ug/mL-FEU (ref 0.00–0.48)

## 2014-02-14 LAB — TROPONIN I: Troponin I: <0.3 ng/mL (ref <0.30)

## 2014-02-14 MED ORDER — DEXAMETHASONE SODIUM PHOSPHATE 10 MG/ML IJ SOLN
10.0000 mg | Freq: Once | INTRAMUSCULAR | Status: AC
  Administered 2014-02-14: 10 mg via INTRAVENOUS
  Filled 2014-02-14: qty 1

## 2014-02-14 MED ORDER — PREDNISONE 20 MG PO TABS: ORAL_TABLET | ORAL | Status: DC

## 2014-02-14 MED ORDER — ALBUTEROL SULFATE (2.5 MG/3ML) 0.083% IN NEBU
5.0000 mg | INHALATION_SOLUTION | Freq: Once | RESPIRATORY_TRACT | Status: AC
  Administered 2014-02-14: 5 mg via RESPIRATORY_TRACT
  Filled 2014-02-14: qty 6

## 2014-02-14 MED ORDER — ALBUTEROL SULFATE HFA 108 (90 BASE) MCG/ACT IN AERS: 1.0000 | INHALATION_SPRAY | Freq: Four times a day (QID) | RESPIRATORY_TRACT | Status: DC | PRN

## 2014-02-14 NOTE — ED Notes (Signed)
Pt reports onset of SOB tonight has hx of seasonal allergies states felt like throat was closing

## 2014-02-14 NOTE — Discharge Instructions (Signed)

## 2014-02-14 NOTE — ED Provider Notes (Signed)
CSN: [id number]     Arrival date & time 02/14/14  0428 History   First MD Initiated Contact with Patient 02/14/14 [phone]     Chief Complaint  Patient presents with  . Shortness of Breath     (Consider location/radiation/quality/duration/timing/severity/associated sxs/prior Treatment) Patient is a 38 y.o. male presenting with shortness of breath. The history is provided by the patient.  Shortness of Breath Severity:  Moderate Onset quality:  Gradual Duration:  1 day Timing:  Constant Progression:  Unchanged Chronicity:  Recurrent Context: pollens   Relieved by:  Nothing Worsened by:  Nothing tried Ineffective treatments:  None tried Associated symptoms: wheezing   Associated symptoms: no diaphoresis   Risk factors: no recent alcohol use and no prolonged immobilization     Past Medical History  Diagnosis Date  . MS (multiple sclerosis)   . Cluster headache   . Epilepsy   . Trigeminal neuralgia    Past Surgical History  Procedure Laterality Date  . Upper gastrointestinal endoscopy    . Wisdom tooth extraction     Family History  Problem Relation Age of Onset  . Diabetes Mother   . Hyperlipidemia Mother   . Heart attack Neg Hx   . Hypertension Neg Hx   . Sudden death Neg Hx    History  Substance Use Topics  . Smoking status: Former Smoker    Types: Cigarettes  . Smokeless tobacco: Never Used  . Alcohol Use: Yes     Comment: occasionally    Review of Systems  Constitutional: Negative for diaphoresis.  HENT: Positive for congestion. Negative for drooling, sneezing, trouble swallowing and voice change.   Respiratory: Positive for shortness of breath and wheezing.   Cardiovascular: Negative for palpitations and leg swelling.  All other systems reviewed and are negative.     Allergies  Adhesive  Home Medications   Prior to Admission medications   Medication Sig Start Date End Date Taking? Authorizing Provider  albuterol (PROVENTIL HFA;VENTOLIN HFA) 108 (90  BASE) MCG/ACT inhaler Inhale 2 puffs into the lungs daily as needed for wheezing or shortness of breath.    Historical Provider, MD  albuterol (PROVENTIL HFA;VENTOLIN HFA) 108 (90 BASE) MCG/ACT inhaler Inhale 1-2 puffs into the lungs every 6 (six) hours as needed for wheezing or shortness of breath. 02/14/14   Alayna Mabe K Frank Novelo-Rasch, MD  cyclobenzaprine (FLEXERIL) 10 MG tablet Take 1 tablet (10 mg total) by mouth 3 (three) times daily as needed for muscle spasms. 07/20/13   Charles B. Bernette Mayers, MD  doxycycline (VIBRAMYCIN) 100 MG capsule Take 1 capsule (100 mg total) by mouth 2 (two) times daily. One po bid x 7 days 01/04/14   Carlisle Beers Molpus, MD  gabapentin (NEURONTIN) 100 MG capsule Take 100 mg by mouth 4 (four) times daily.    Historical Provider, MD  ibuprofen (ADVIL,MOTRIN) 800 MG tablet Take 1 tablet (800 mg total) by mouth 3 (three) times daily. 07/20/13   Charles B. Bernette Mayers, MD  oxyCODONE-acetaminophen (PERCOCET/ROXICET) 5-325 MG per tablet Take 2 tablets by mouth every 6 (six) hours as needed for pain. 07/21/13   Dorna Leitz, MD  predniSONE (DELTASONE) 20 MG tablet 3 tabs po day one, then 2 po daily x 4 days 02/14/14   Aanvi Voyles K Novalee Horsfall-Rasch, MD  predniSONE (STERAPRED UNI-PAK) 10 MG tablet 6 tabs po days 1-2, 5 tabs po days 3-4, 4 tabs po days 5-6, 3 tabs po days 7-8, 2 tabs po days 9-10, 1 tab po days 11-12 08/14/13  Lenda KelpShane R Hudnall, MD  Vitamin D, Ergocalciferol, (DRISDOL) 50000 UNITS CAPS capsule Take 50,000 Units by mouth every 7 (seven) days. On Wednesday    Historical Provider, MD   BP 113/80  Pulse 100  Temp(Src) 98.3 F (36.8 C) (Oral)  Resp 20  Ht 5\' 10"  (1.778 m)  Wt 135 lb (61.236 kg)  BMI 19.37 kg/m2  SpO2 97% Physical Exam  Constitutional: He is oriented to person, place, and time. He appears well-developed and well-nourished. No distress.  HENT:  Head: Normocephalic and atraumatic.  Mouth/Throat: Oropharynx is clear and moist.  Eyes: Conjunctivae are normal. Pupils are equal,  round, and reactive to light.  Neck: Normal range of motion. Neck supple.  nO PAIN WITH DISPLACEMENT OF THE TRACHEA  Cardiovascular: Normal rate, regular rhythm and intact distal pulses.   Pulmonary/Chest: Effort normal and breath sounds normal. No stridor. He has no wheezes. He has no rales.  Already received neb prior to being seen and feels back to normal  Abdominal: Soft. Bowel sounds are normal. There is no tenderness. There is no rebound and no guarding.  Musculoskeletal: Normal range of motion.  Lymphadenopathy:    He has no cervical adenopathy.  Neurological: He is alert and oriented to person, place, and time.  Skin: Skin is warm and dry. He is not diaphoretic.  Psychiatric: He has a normal mood and affect.    ED Course  Procedures (including critical care time) Labs Review Labs Reviewed  BASIC METABOLIC PANEL - Abnormal; Notable for the following:    Glucose, Bld 123 (*)    All other components within normal limits  URINE RAPID DRUG SCREEN (HOSP PERFORMED) - Abnormal; Notable for the following:    Tetrahydrocannabinol POSITIVE (*)    All other components within normal limits  CBC WITH DIFFERENTIAL  TROPONIN I  D-DIMER, QUANTITATIVE    Imaging Review Dg Chest 2 View  02/14/2014   CLINICAL DATA:  Shortness of Breath.  EXAM: CHEST  2 VIEW  COMPARISON:  DG CHEST 2 VIEW dated 02/10/2014  FINDINGS: Hyperinflation. The heart size and mediastinal contours are within normal limits. Both lungs are clear. The visualized skeletal structures are unremarkable.  IMPRESSION: No active cardiopulmonary disease.   Electronically Signed   By: Burman NievesWilliam  Stevens M.D.   On: 02/14/2014 05:46     EKG Interpretation   Date/Time:  Sunday Feb 14 2014 05:23:21 EDT Ventricular Rate:  98 PR Interval:  140 QRS Duration: 82 QT Interval:  340 QTC Calculation: 434 R Axis:   78 Text Interpretation:  Normal sinus rhythm Nonspecific ST and T wave  abnormality Confirmed by [facility]  MD, Adean Milosevic  ([id number]) on 02/14/2014  5:34:37 AM      MDM   Final diagnoses:  Asthma attack  Do not thinks is cardiac, in the setting of negative ekg and troponin with > 8 hours of symptoms ACS is excluded  Patient is improved.  Will treat with inhaler and steroids.  Follow up with your family doctor for ongoing care    Rykker Coviello K Urbano Milhouse-Rasch, MD 02/14/14 [phone]

## 2014-03-15 ENCOUNTER — Emergency Department (HOSPITAL_BASED_OUTPATIENT_CLINIC_OR_DEPARTMENT_OTHER)
Admission: EM | Admit: 2014-03-15 | Discharge: 2014-03-15 | Disposition: A | Discharge status: Home / Self Care | Payer: Medicare Other | Attending: Emergency Medicine | Admitting: Emergency Medicine

## 2014-03-15 ENCOUNTER — Encounter (HOSPITAL_BASED_OUTPATIENT_CLINIC_OR_DEPARTMENT_OTHER): Payer: Self-pay | Admitting: Emergency Medicine

## 2014-03-15 DIAGNOSIS — G44009 Cluster headache syndrome, unspecified, not intractable: Secondary | ICD-10-CM | POA: Insufficient documentation

## 2014-03-15 DIAGNOSIS — G500 Trigeminal neuralgia: Secondary | ICD-10-CM | POA: Insufficient documentation

## 2014-03-15 DIAGNOSIS — W5503XA Scratched by cat, initial encounter: Secondary | ICD-10-CM

## 2014-03-15 DIAGNOSIS — IMO0002 Reserved for concepts with insufficient information to code with codable children: Secondary | ICD-10-CM | POA: Insufficient documentation

## 2014-03-15 DIAGNOSIS — Y939 Activity, unspecified: Secondary | ICD-10-CM | POA: Insufficient documentation

## 2014-03-15 DIAGNOSIS — Z23 Encounter for immunization: Secondary | ICD-10-CM | POA: Insufficient documentation

## 2014-03-15 DIAGNOSIS — G40909 Epilepsy, unspecified, not intractable, without status epilepticus: Secondary | ICD-10-CM | POA: Insufficient documentation

## 2014-03-15 DIAGNOSIS — S50811A Abrasion of right forearm, initial encounter: Secondary | ICD-10-CM

## 2014-03-15 DIAGNOSIS — Z791 Long term (current) use of non-steroidal anti-inflammatories (NSAID): Secondary | ICD-10-CM | POA: Insufficient documentation

## 2014-03-15 DIAGNOSIS — Z79899 Other long term (current) drug therapy: Secondary | ICD-10-CM | POA: Insufficient documentation

## 2014-03-15 DIAGNOSIS — Y929 Unspecified place or not applicable: Secondary | ICD-10-CM | POA: Insufficient documentation

## 2014-03-15 DIAGNOSIS — G35 Multiple sclerosis: Secondary | ICD-10-CM | POA: Insufficient documentation

## 2014-03-15 DIAGNOSIS — W64XXXA Exposure to other animate mechanical forces, initial encounter: Secondary | ICD-10-CM | POA: Insufficient documentation

## 2014-03-15 DIAGNOSIS — S51809A Unspecified open wound of unspecified forearm, initial encounter: Secondary | ICD-10-CM | POA: Insufficient documentation

## 2014-03-15 DIAGNOSIS — Z87891 Personal history of nicotine dependence: Secondary | ICD-10-CM | POA: Insufficient documentation

## 2014-03-15 DIAGNOSIS — Z792 Long term (current) use of antibiotics: Secondary | ICD-10-CM | POA: Insufficient documentation

## 2014-03-15 MED ORDER — TETANUS-DIPHTH-ACELL PERTUSSIS 5-2.5-18.5 LF-MCG/0.5 IM SUSP
0.5000 mL | Freq: Once | INTRAMUSCULAR | Status: AC
  Administered 2014-03-15: 0.5 mL via INTRAMUSCULAR
  Filled 2014-03-15: qty 0.5

## 2014-03-15 NOTE — ED Notes (Signed)
Wound cleansed in triage by EMT.

## 2014-03-15 NOTE — ED Notes (Signed)
Pt got scratched on his right forearm this afternoon by his own cat.  Cat is UTD on it shots.  Pt sts he needs a tetanus.

## 2014-03-15 NOTE — Discharge Instructions (Signed)
Keep wound clean and dry  Wash several times a day with soap and water  Apply antibiotic ointment daily  Return to the emergency department if you develop any changing/worsening condition, spreading redness/swelling, drainage, severe pain, or any other concerns (please read additional information regarding your condition below)    Laceration Care, Adult A laceration is a cut or lesion that goes through all layers of the skin and into the tissue just beneath the skin. TREATMENT  Some lacerations may not require closure. Some lacerations may not be able to be closed due to an increased risk of infection. It is important to see your caregiver as soon as possible after an injury to minimize the risk of infection and maximize the opportunity for successful closure. If closure is appropriate, pain medicines may be given, if needed. The wound will be cleaned to help prevent infection. Your caregiver will use stitches (sutures), staples, wound glue (adhesive), or skin adhesive strips to repair the laceration. These tools bring the skin edges together to allow for faster healing and a better cosmetic outcome. However, all wounds will heal with a scar. Once the wound has healed, scarring can be minimized by covering the wound with sunscreen during the day for 1 full year. HOME CARE INSTRUCTIONS  For sutures or staples:  Keep the wound clean and dry.  If you were given a bandage (dressing), you should change it at least once a day. Also, change the dressing if it becomes wet or dirty, or as directed by your caregiver.  Wash the wound with soap and water 2 times a day. Rinse the wound off with water to remove all soap. Pat the wound dry with a clean towel.  After cleaning, apply a thin layer of the antibiotic ointment as recommended by your caregiver. This will help prevent infection and keep the dressing from sticking.  You may shower as usual after the first 24 hours. Do not soak the wound in water  until the sutures are removed.  Only take over-the-counter or prescription medicines for pain, discomfort, or fever as directed by your caregiver.  Get your sutures or staples removed as directed by your caregiver. For skin adhesive strips:  Keep the wound clean and dry.  Do not get the skin adhesive strips wet. You may bathe carefully, using caution to keep the wound dry.  If the wound gets wet, pat it dry with a clean towel.  Skin adhesive strips will fall off on their own. You may trim the strips as the wound heals. Do not remove skin adhesive strips that are still stuck to the wound. They will fall off in time. For wound adhesive:  You may briefly wet your wound in the shower or bath. Do not soak or scrub the wound. Do not swim. Avoid periods of heavy perspiration until the skin adhesive has fallen off on its own. After showering or bathing, gently pat the wound dry with a clean towel.  Do not apply liquid medicine, cream medicine, or ointment medicine to your wound while the skin adhesive is in place. This may loosen the film before your wound is healed.  If a dressing is placed over the wound, be careful not to apply tape directly over the skin adhesive. This may cause the adhesive to be pulled off before the wound is healed.  Avoid prolonged exposure to sunlight or tanning lamps while the skin adhesive is in place. Exposure to ultraviolet light in the first year will darken the scar.  The skin adhesive will usually remain in place for 5 to 10 days, then naturally fall off the skin. Do not pick at the adhesive film. You may need a tetanus shot if:  You cannot remember when you had your last tetanus shot.  You have never had a tetanus shot. If you get a tetanus shot, your arm may swell, get red, and feel warm to the touch. This is common and not a problem. If you need a tetanus shot and you choose not to have one, there is a rare chance of getting tetanus. Sickness from tetanus can  be serious. SEEK MEDICAL CARE IF:   You have redness, swelling, or increasing pain in the wound.  You see a red line that goes away from the wound.  You have yellowish-white fluid (pus) coming from the wound.  You have a fever.  You notice a bad smell coming from the wound or dressing.  Your wound breaks open before or after sutures have been removed.  You notice something coming out of the wound such as wood or glass.  Your wound is on your hand or foot and you cannot move a finger or toe. SEEK IMMEDIATE MEDICAL CARE IF:   Your pain is not controlled with prescribed medicine.  You have severe swelling around the wound causing pain and numbness or a change in color in your arm, hand, leg, or foot.  Your wound splits open and starts bleeding.  You have worsening numbness, weakness, or loss of function of any joint around or beyond the wound.  You develop painful lumps near the wound or on the skin anywhere on your body. MAKE SURE YOU:   Understand these instructions.  Will watch your condition.  Will get help right away if you are not doing well or get worse. Document Released: 10/01/2005 Document Revised: 12/24/2011 Document Reviewed: 03/27/2011 [facility] Patient Information 2014 [place], [place].   Emergency Department Resource Guide 1) Find a Doctor and Pay Out of Pocket Although you won't have to find out who is covered by your insurance plan, it is a good idea to ask around and get recommendations. You will then need to call the office and see if the doctor you have chosen will accept you as a new patient and what types of options they offer for patients who are self-pay. Some doctors offer discounts or will set up payment plans for their patients who do not have insurance, but you will need to ask so you aren't surprised when you get to your appointment.  2) Contact Your Local Health Department Not all health departments have doctors that can see patients for sick  visits, but many do, so it is worth a call to see if yours does. If you don't know where your local health department is, you can check in your phone book. The CDC also has a tool to help you locate your state's health department, and many state websites also have listings of all of their local health departments.  3) Find a Walk-in Clinic If your illness is not likely to be very severe or complicated, you may want to try a walk in clinic. These are popping up all over the country in pharmacies, drugstores, and shopping centers. They're usually staffed by nurse practitioners or physician assistants that have been trained to treat common illnesses and complaints. They're usually fairly quick and inexpensive. However, if you have serious medical issues or chronic medical problems, these are probably not your best option.  No Primary Care Doctor: - Call Health Connect at  [phone] - they can help you locate a primary care doctor that  accepts your insurance, provides certain services, etc. - Physician Referral Service- [phone]  Chronic Pain Problems: Organization         Address  Phone   Notes  [facility]  [phone] Patients need to be referred by their primary care doctor.   Medication Assistance: Organization         Address  Phone   Notes  [facility] Medication [facility] [address] [place], [place] [address] [phone] --Must be a resident of [facility] -- Must have NO insurance coverage whatsoever (no Medicaid/ Medicare, etc.) -- The pt. MUST have a primary care doctor that directs their care regularly and follows them in the community   [facility]ist  [phone]   [employer]  [phone]    Agencies that provide inexpensive medical care: Organization         Address  Phone   Notes  [facility]  [phone]   [facility]    [phone]   [facility] [address] [place], [place] [address] [phone]   [facility] [address] [place] [place] [phone]   Planned Parenthood    [phone]   [facility]    [phone]   [facility]  [address], [place] Phone:  [phone], Fax:  [phone] Hours of Operation:  9 am - 6 pm, M-F.  Also accepts Medicaid/Medicare and self-pay.  [facility]  [address], [place] Phone: [phone], Fax: [phone]. Hours of Operation:  8:30 am - 5:30 pm, M-F.  Also accepts Medicaid and self-pay.  [facility] [place] [address], [place] Phone: [phone]   [facility] [address] Natasha Bence [place], [place] [phone], Ext. [phone] Mondays & Thursdays: 7-9 AM.  First 15 patients are seen on a first come, first serve basis.    Medicaid-accepting [facility] Providers:  Organization         Address  Phone   Notes  [facility] [address], [place] [phone] Also accepts self-pay patients.  [facility] [address] Laurell Josephs [place], [place]  [phone]   [facility] [address], [place] [phone]   [facility] [address], [place] [phone]   Renaye Rakers [address], [place]   [phone] Only accepts [place] Access [place] patients after they have their name applied to their card.   Self-Pay (no insurance) in [facility]:  Organization         Address  Phone   Notes  Sickle Cell Patients, [facility] [address] [place], [place] [phone]   [facility]gent Care [address] [place], [place] [phone]   [facility] [place]  [address], [place] [phone]   [facility]/Dr. Osei-Bonsu  [address]  [place] or [address], [place] [phone] Phone number for both [place] and [place] locations is the same.  [facility] [address], [place] [phone]  [facility] [address] [place] [address], [place] or [address] [phone]   [facility] [address] [place], [place] [phone]) [phone], phone; [phone], fax Sees patients 1st and 3rd Saturday of every month.  Must not qualify for public or private insurance (i.e. Medicaid, Medicare, [place] Health Choice, Veterans' Benefits)  Household income should be no more than 200% of the poverty level The clinic cannot treat you if you are pregnant or think you are pregnant  Sexually transmitted diseases are not treated at the clinic.    Dental Care: Organization         Address  Phone  Notes  [facility] [address], [place] [phone] Accepts children up to age 621 who are enrolled in [place] or [place] Health Choice; pregnant women with a Medicaid card; and children who have applied for Medicaid or [place] Health Choice, but were declined, whose parents can pay a reduced fee at time of service.  [facility] [place]  [address], [place] [phone]) [phone] Accepts children up to age 39 who are enrolled in [place] or [place] Health Choice; pregnant women with a Medicaid card; and children who have applied for Medicaid or [place] Health Choice, but were declined, whose parents can pay a reduced fee at time of service.  [facility]  [address], [place] [phone] Patients are seen by appointment only. Walk-ins are not accepted. [facility] will see patients 39 years of age and older. Monday - Tuesday (8am-5pm) Most Wednesdays (8:30-5pm) $30 per visit, cash only  [facility]  [address], [facility] [phone] Patients are seen by appointment only. Walk-ins are not accepted. [facility] will see patients 39 years of age and older. One Wednesday Evening (Monthly: Volunteer Based).  $30 per visit, cash only  [employer] [facility]  [phone]) [phone] for adults; Children under age 624, call Graduate Pediatric Dentistry at [phone]) [phone]. Children aged 334-14, please call [phone]) [phone] to request a pediatric application.  Dental services are provided in all areas of dental care including fillings, crowns and bridges, complete and partial dentures, implants, gum treatment, root canals, and extractions. Preventive care is also provided. Treatment is provided to both adults and children. Patients are selected via a lottery and there is often a waiting list.   [facility] [address], [place]  [phone] [email or web address]   [facility] [address], [place], [place] [phone], Ext. [phone] Second and Fourth Thursday of each month, opens at 6:30 AM; Clinic ends at 9 AM.  Patients are seen on a first-come first-served basis, and a limited number are seen during each clinic.   [facility]  [address], [place], [place] [phone]) [phone]   Eligibility Requirements You must have lived in [place], [place], or [place] counties for at least the last three months.   You cannot be eligible for state or federal sponsored [place] Cityhealthcare insurance, including CIGNAVeterans Administration, [place]icaid, or [employer]icare.   You generally cannot be eligible for healthcare insurance through your employer.    How to apply: Eligibility screenings are held every Tuesday and Wednesday afternoon from 1:00 pm until 4:00 pm. You do not need an appointment for the interview!  [facility] [address], [place], [place] [phone]   Aaron [place]  [employer] Department  [phone]   [facility] Health Department  [phone]   [facility] Health Department  [phone]    Behavioral Health Resources in the Community: Intensive Outpatient Programs Organization         Address  Phone  Notes  [facility] [address] [place], [place] [phone]   [facility] Outpatient [address], [place], [place] [phone]   [facility] [address] [place], [place]  [phone]   [facility] Mental Health [address]  [place], [place] [phone] or [phone]   Substance Abuse Resources Organization         Address  Phone  Notes  [facility] Drug [facility]  [phone]   [facility]  [phone]   [facility]  [phone]   Floydene Flock  [phone]   Residential & Outpatient Substance Abuse Program  [phone]   Psychological Services Organization         Address  Phone  Notes  [facility] Behavioral Health  [phone]   [facility] Services  [phone]   [facility] Mental Health [address] [place] [phone] or [phone]    Mobile Crisis Teams Organization         Address  Phone  Notes  [facility], Mobile Crisis Care Unit  [phone]   [facility]  [address] [place], [place] [phone]   Doristine Locks [address] [place] [phone]    Self-Help/Support Groups Organization         Address  Phone             Notes  [facility] [place] - variety of support groups  336- I7437963 Call for more information  Narcotics Anonymous (NA), [facility] [address], [employer] [place]  2 meetings at this location   Statistician         Address  Phone  Notes  [facility] [address],    [place]  [phone]   [facility]  [address] [place] [address], [place], [place] [phone]   [facility] [address] [place], [place]  [phone] Admissions: 8am-3pm M-F  [facility] [address],    [place], [place] [phone]   [facility] [address] [place], [place], [place] [phone]   [facility] [address],  [place], [place] [phone]   [facility] - Intensive Outpatient [address] Laurell Josephs [address], [place], [place] [phone]   [facility] ([facility]iction [facility] [address]  [place], [place] [phone] or [phone]   [facility] ([facility]) [address] [place], [place] [phone] Accepts Medicaid  [facility] [place] [address],  [place] [phone] Substance Abuse/Addiction Treatment   [facility] Organization         Address  Phone  Notes  [facility]  [phone]   Angie Fava, PhD [address] Ervin Knack [place], [place]   [phone] or [phone]   [facility]al   [address] [place], [place] [phone]   [facility] [address] [place], [place] [phone] Insurance/Medicaid/sponsorship through [employer] and Families [address]  [place], [place] [phone] [place], [place] [phone]    Dr. Adele Schilder  [phone]   [facility] Dept. 1) [address] [place] 2) [place] 3)  [address], [place] [phone]  [phone]   [place] [phone] or [phone] (After Hours)

## 2014-03-15 NOTE — ED Provider Notes (Signed)
CSN: [id number]     Arrival date & time 03/15/14  2144 History   First MD Initiated Contact with Patient 03/15/14 2310     Chief Complaint  Patient presents with  . Cat scratch    HPI  CURT DUPLER is a 38 y.o. male with a PMH of MS, headache, epilepsy, and trigeminal neuralgia who presents to the ED for evaluation of a cat scratch. History was provided by the patient. Patient states that this afternoon his cat scratched his right forearm. Patient washed the area a few times with soap and water PTA. He denies any bite wounds. No other injures. He states he came to the ED for a tetanus shot because his is out of date. He tried calling his PCP but their office was closed. Cat's immunizations are UTD (rabies).    Past Medical History  Diagnosis Date  . MS (multiple sclerosis)   . Cluster headache   . Epilepsy   . Trigeminal neuralgia    Past Surgical History  Procedure Laterality Date  . Upper gastrointestinal endoscopy    . Wisdom tooth extraction     Family History  Problem Relation Age of Onset  . Diabetes Mother   . Hyperlipidemia Mother   . Heart attack Neg Hx   . Hypertension Neg Hx   . Sudden death Neg Hx    History  Substance Use Topics  . Smoking status: Former Smoker    Types: Cigarettes  . Smokeless tobacco: Never Used  . Alcohol Use: Yes     Comment: occasionally    Review of Systems  Constitutional: Negative for fever, chills, activity change, appetite change and fatigue.  Gastrointestinal: Negative for nausea, vomiting and abdominal pain.  Musculoskeletal: Negative for arthralgias and joint swelling.  Skin: Positive for wound. Negative for color change.  Neurological: Negative for weakness.     Allergies  Adhesive  Home Medications   Prior to Admission medications   Medication Sig Start Date End Date Taking? Authorizing Provider  albuterol (PROVENTIL HFA;VENTOLIN HFA) 108 (90 BASE) MCG/ACT inhaler Inhale 2 puffs into the lungs daily as needed for  wheezing or shortness of breath.    Historical Provider, MD  albuterol (PROVENTIL HFA;VENTOLIN HFA) 108 (90 BASE) MCG/ACT inhaler Inhale 1-2 puffs into the lungs every 6 (six) hours as needed for wheezing or shortness of breath. 02/14/14   April K Palumbo-Rasch, MD  cyclobenzaprine (FLEXERIL) 10 MG tablet Take 1 tablet (10 mg total) by mouth 3 (three) times daily as needed for muscle spasms. 07/20/13   Charles B. Bernette Mayers, MD  doxycycline (VIBRAMYCIN) 100 MG capsule Take 1 capsule (100 mg total) by mouth 2 (two) times daily. One po bid x 7 days 01/04/14   Carlisle Beers Molpus, MD  gabapentin (NEURONTIN) 100 MG capsule Take 100 mg by mouth 4 (four) times daily.    Historical Provider, MD  ibuprofen (ADVIL,MOTRIN) 800 MG tablet Take 1 tablet (800 mg total) by mouth 3 (three) times daily. 07/20/13   Charles B. Bernette Mayers, MD  oxyCODONE-acetaminophen (PERCOCET/ROXICET) 5-325 MG per tablet Take 2 tablets by mouth every 6 (six) hours as needed for pain. 07/21/13   Dorna Leitz, MD  predniSONE (DELTASONE) 20 MG tablet 3 tabs po day one, then 2 po daily x 4 days 02/14/14   April K Palumbo-Rasch, MD  predniSONE (STERAPRED UNI-PAK) 10 MG tablet 6 tabs po days 1-2, 5 tabs po days 3-4, 4 tabs po days 5-6, 3 tabs po days 7-8, 2 tabs po days  9-10, 1 tab po days 11-12 08/14/13   Lenda KelpShane R Hudnall, MD  Vitamin D, Ergocalciferol, (DRISDOL) 50000 UNITS CAPS capsule Take 50,000 Units by mouth every 7 (seven) days. On Wednesday    Historical Provider, MD   BP 107/72  Pulse 84  Temp(Src) 98.8 F (37.1 C) (Oral)  Resp 16  Ht 5\' 10"  (1.778 m)  Wt 135 lb (61.236 kg)  BMI 19.37 kg/m2  SpO2 99%  Filed Vitals:   03/15/14 2151 03/15/14 2334  BP: 107/72 138/68  Pulse: 84 78  Temp: 98.8 F (37.1 C)   TempSrc: Oral   Resp: 16 14  Height: 5\' 10"  (1.778 m)   Weight: 135 lb (61.236 kg)   SpO2: 99% 100%    Physical Exam  Nursing note and vitals reviewed. Constitutional: He is oriented to person, place, and time. He appears  well-developed and well-nourished. No distress.  HENT:  Head: Normocephalic and atraumatic.  Right Ear: External ear normal.  Left Ear: External ear normal.  Mouth/Throat: Oropharynx is clear and moist.  Eyes: Conjunctivae are normal. Right eye exhibits no discharge. Left eye exhibits no discharge.  Neck: Neck supple.  Cardiovascular: Normal rate.   Radial pedis pulses present bilaterally  Pulmonary/Chest: Effort normal.  Abdominal: Soft. He exhibits no distension. There is no tenderness.  Musculoskeletal: He exhibits no edema and no tenderness.       Arms: ROM intact in the right UE. Patient able to ambulate without difficulty or ataxia  Neurological: He is alert and oriented to person, place, and time.  Skin: Skin is warm and dry. He is not diaphoretic.  10 cm linear superficial laceration to the right anterior forearm with no active drainage or bleeding. No surrounding edema/erythema. Area non-tender to palpation.      ED Course  Procedures (including critical care time) Labs Review Labs Reviewed - No data to display  Imaging Review No results found.   EKG Interpretation None      MDM   Kandis NabJeremy M Tudisco is a 38 y.o. male with a PMH of MS, headache, epilepsy, and trigeminal neuralgia who presents to the ED for evaluation of a cat scratch. Tetanus booster given in ED. Rabies UTD on cat. Wound washed in the ED. Antibiotic ointment and dressing applied. No laceration repair. Wound care discussed with patient. Return precautions, discharge instructions, and follow-up was discussed with the patient before discharge.     Discharge Medication List as of 03/15/2014 11:26 PM       Final impressions: 1. Cat scratch of right forearm       Thomasenia SalesJessica Katlin Daymeon Fischman PA-C          Jillyn LedgerJessica K Ardella Chhim, [place] JerseyPA-C 03/16/14 [phone]

## 2014-03-30 NOTE — ED Provider Notes (Signed)
Medical screening examination/treatment/procedure(s) were performed by non-physician practitioner and as supervising physician I was immediately available for consultation/collaboration.   EKG Interpretation None        Rolland Porter, MD 03/30/14 [phone]

## 2014-05-22 ENCOUNTER — Encounter (HOSPITAL_BASED_OUTPATIENT_CLINIC_OR_DEPARTMENT_OTHER): Payer: Self-pay | Admitting: Emergency Medicine

## 2014-05-22 ENCOUNTER — Emergency Department (HOSPITAL_BASED_OUTPATIENT_CLINIC_OR_DEPARTMENT_OTHER)
Admission: EM | Admit: 2014-05-22 | Discharge: 2014-05-22 | Disposition: A | Discharge status: Home / Self Care | Payer: Medicare Other | Attending: Emergency Medicine | Admitting: Emergency Medicine

## 2014-05-22 ENCOUNTER — Emergency Department (HOSPITAL_BASED_OUTPATIENT_CLINIC_OR_DEPARTMENT_OTHER): Payer: Medicare Other

## 2014-05-22 DIAGNOSIS — Z79899 Other long term (current) drug therapy: Secondary | ICD-10-CM | POA: Insufficient documentation

## 2014-05-22 DIAGNOSIS — G40909 Epilepsy, unspecified, not intractable, without status epilepticus: Secondary | ICD-10-CM | POA: Insufficient documentation

## 2014-05-22 DIAGNOSIS — R109 Unspecified abdominal pain: Secondary | ICD-10-CM | POA: Insufficient documentation

## 2014-05-22 DIAGNOSIS — G35 Multiple sclerosis: Secondary | ICD-10-CM | POA: Diagnosis not present

## 2014-05-22 DIAGNOSIS — Z87891 Personal history of nicotine dependence: Secondary | ICD-10-CM | POA: Insufficient documentation

## 2014-05-22 LAB — BASIC METABOLIC PANEL
ANION GAP: 12 (ref 5–15)
BUN: 15 mg/dL (ref 6–23)
CO2: 25 mEq/L (ref 19–32)
Calcium: 9.3 mg/dL (ref 8.4–10.5)
Chloride: 103 mEq/L (ref 96–112)
Creatinine, Ser: 1 mg/dL (ref 0.50–1.35)
GFR calc non Af Amer: >90 mL/min (ref >90)
Glucose, Bld: 111 mg/dL — ABNORMAL HIGH (ref 70–99)
POTASSIUM: 3.8 meq/L (ref 3.7–5.3)
SODIUM: 140 meq/L (ref 137–147)

## 2014-05-22 LAB — URINALYSIS, ROUTINE W REFLEX MICROSCOPIC
Bilirubin Urine: NEGATIVE
Glucose, UA: NEGATIVE mg/dL
Hgb urine dipstick: NEGATIVE
Ketones, ur: NEGATIVE mg/dL
LEUKOCYTES UA: NEGATIVE
NITRITE: NEGATIVE
PH: 6 (ref 5.0–8.0)
Protein, ur: NEGATIVE mg/dL
SPECIFIC GRAVITY, URINE: 1.024 (ref 1.005–1.030)
Urobilinogen, UA: 1 mg/dL (ref 0.0–1.0)

## 2014-05-22 MED ORDER — HYDROCODONE-ACETAMINOPHEN 5-325 MG PO TABS: 1.0000 | ORAL_TABLET | Freq: Four times a day (QID) | ORAL | Status: DC | PRN

## 2014-05-22 MED ORDER — MORPHINE SULFATE 4 MG/ML IJ SOLN
4.0000 mg | Freq: Once | INTRAMUSCULAR | Status: AC
  Administered 2014-05-22: 4 mg via INTRAVENOUS
  Filled 2014-05-22: qty 1

## 2014-05-22 MED ORDER — ONDANSETRON HCL 4 MG/2ML IJ SOLN
4.0000 mg | Freq: Once | INTRAMUSCULAR | Status: AC
  Administered 2014-05-22: 4 mg via INTRAVENOUS
  Filled 2014-05-22: qty 2

## 2014-05-22 MED ORDER — METHOCARBAMOL 500 MG PO TABS
500.0000 mg | ORAL_TABLET | Freq: Two times a day (BID) | ORAL | Status: DC
Start: 2014-05-22 — End: 2016-04-19

## 2014-05-22 NOTE — ED Provider Notes (Signed)
CSN: [id number]     Arrival date & time 05/22/14  1753 History   First MD Initiated Contact with Patient 05/22/14 1912     Chief Complaint  Patient presents with  . Flank Pain     (Consider location/radiation/quality/duration/timing/severity/associated sxs/prior Treatment) HPI Comments: Patient presents with a chief complaint of right flank pain.  He reports that the pain has been occurring intermittently over the past week, but worse over the past couple of days.  He states that the pain "comes in waves."  He reports that the pain is now radiating to the suprapubic area.  He reports taking OTC pain medication without relief.  He states that he has had associated nausea, but no vomiting.  No diarrhea.  He also reports that his urine has been cloudy in color, but denies hematuria.  Denies dysuria, increased urinary frequency, or urgency.  Denies fever or chills.  Denies scrotal pain or swelling.  He reports that nothing makes the pain or worse.  He has never had pain like this before.  No acute injury or trauma.  No history of kidney stones.  Patient is a 38 y.o. male presenting with flank pain. The history is provided by the patient.  Flank Pain    Past Medical History  Diagnosis Date  . MS (multiple sclerosis)   . Cluster headache   . Epilepsy   . Trigeminal neuralgia    Past Surgical History  Procedure Laterality Date  . Upper gastrointestinal endoscopy    . Wisdom tooth extraction     Family History  Problem Relation Age of Onset  . Diabetes Mother   . Hyperlipidemia Mother   . Heart attack Neg Hx   . Hypertension Neg Hx   . Sudden death Neg Hx    History  Substance Use Topics  . Smoking status: Former Smoker    Types: Cigarettes  . Smokeless tobacco: Never Used  . Alcohol Use: Yes     Comment: occasionally    Review of Systems  Genitourinary: Positive for flank pain.  All other systems reviewed and are negative.     Allergies  Adhesive  Home Medications    Prior to Admission medications   Medication Sig Start Date End Date Taking? Authorizing Provider  albuterol (PROVENTIL HFA;VENTOLIN HFA) 108 (90 BASE) MCG/ACT inhaler Inhale 2 puffs into the lungs daily as needed for wheezing or shortness of breath.    Historical Provider, MD  albuterol (PROVENTIL HFA;VENTOLIN HFA) 108 (90 BASE) MCG/ACT inhaler Inhale 1-2 puffs into the lungs every 6 (six) hours as needed for wheezing or shortness of breath. 02/14/14   April K Palumbo-Rasch, MD  cyclobenzaprine (FLEXERIL) 10 MG tablet Take 1 tablet (10 mg total) by mouth 3 (three) times daily as needed for muscle spasms. 07/20/13   Charles B. Bernette Mayers, MD  doxycycline (VIBRAMYCIN) 100 MG capsule Take 1 capsule (100 mg total) by mouth 2 (two) times daily. One po bid x 7 days 01/04/14   Carlisle Beers Molpus, MD  gabapentin (NEURONTIN) 100 MG capsule Take 100 mg by mouth 4 (four) times daily.    Historical Provider, MD  ibuprofen (ADVIL,MOTRIN) 800 MG tablet Take 1 tablet (800 mg total) by mouth 3 (three) times daily. 07/20/13   Charles B. Bernette Mayers, MD  oxyCODONE-acetaminophen (PERCOCET/ROXICET) 5-325 MG per tablet Take 2 tablets by mouth every 6 (six) hours as needed for pain. 07/21/13   Dorna Leitz, MD  predniSONE (DELTASONE) 20 MG tablet 3 tabs po day one, then 2 po  daily x 4 days 02/14/14   April K Palumbo-Rasch, MD  predniSONE (STERAPRED UNI-PAK) 10 MG tablet 6 tabs po days 1-2, 5 tabs po days 3-4, 4 tabs po days 5-6, 3 tabs po days 7-8, 2 tabs po days 9-10, 1 tab po days 11-12 08/14/13   Lenda KelpShane R Hudnall, MD  Vitamin D, Ergocalciferol, (DRISDOL) 50000 UNITS CAPS capsule Take 50,000 Units by mouth every 7 (seven) days. On Wednesday    Historical Provider, MD   BP 110/70  Pulse 64  Temp(Src) 97.4 F (36.3 C) (Oral)  Resp 16  SpO2 98% Physical Exam  Nursing note and vitals reviewed. Constitutional: He appears well-developed and well-nourished.  HENT:  Head: Normocephalic and atraumatic.  Mouth/Throat: Oropharynx is clear  and moist.  Neck: Normal range of motion. Neck supple.  Cardiovascular: Normal rate, regular rhythm and normal heart sounds.   Pulmonary/Chest: Effort normal and breath sounds normal.  Abdominal: Soft. Bowel sounds are normal. He exhibits no distension and no mass. There is no tenderness. There is CVA tenderness. There is no rebound and no guarding.  Right CVA tenderness  Musculoskeletal: Normal range of motion.  Neurological: He is alert.  Skin: Skin is warm and dry.  Psychiatric: He has a normal mood and affect.    ED Course  Procedures (including critical care time) Labs Review Labs Reviewed  BASIC METABOLIC PANEL - Abnormal; Notable for the following:    Glucose, Bld 111 (*)    All other components within normal limits  URINALYSIS, ROUTINE W REFLEX MICROSCOPIC    Imaging Review Ct Abdomen Pelvis Wo Contrast  05/22/2014   CLINICAL DATA:  Right flank pain, nausea/vomiting, evaluate for kidney stone  EXAM: CT ABDOMEN AND PELVIS WITHOUT CONTRAST  TECHNIQUE: Multidetector CT imaging of the abdomen and pelvis was performed following the standard protocol without IV contrast.  COMPARISON:  05/25/2013  FINDINGS: Lung bases are clear.  Unenhanced liver, spleen, pancreas, and adrenal glands are within normal limits.  Gallbladder is unremarkable. No intrahepatic or extrahepatic ductal dilatation.  Kidneys are unremarkable.  No renal calculi or hydronephrosis.  No evidence of bowel obstruction.  Normal appendix.  No evidence of abdominal aortic aneurysm.  No abdominopelvic ascites.  No suspicious abdominopelvic lymphadenopathy.  Prostate is unremarkable.  Bladder is within normal limits.  Visualized osseous structures are within normal limits.  IMPRESSION: No renal, ureteral, or bladder calculi.  No hydronephrosis.  No evidence of bowel obstruction.  Normal appendix.  No CT findings to account for the patient's right flank pain.   Electronically Signed   By: Charline BillsSriyesh  Krishnan M.D.   On: 05/22/2014  20:54     EKG Interpretation None     9:24 PM Patient reports that pain has resolved at this time.  Abdomen soft and nontender. MDM   Final diagnoses:  None   Patient presenting with a chief complaint of right flank pain that has been occurring intermittently over the past week.  No acute injury or trauma.  Patient is afebrile.  No abdominal tenderness on exam.  No rebound or guarding.  UA is negative.  Labs unremarkable.  CT ab/pelvis with no acute findings.  Pain and nausea improved during ED course.  Feel that the patient is stable for discharge.  Return precautions given.    Santiago GladHeather Elvie Maines, PA-C 05/24/14 [phone]

## 2014-05-22 NOTE — ED Notes (Signed)
Having right flank pain. N/V. Started a few days ago.

## 2014-05-25 NOTE — ED Provider Notes (Signed)
Medical screening examination/treatment/procedure(s) were performed by non-physician practitioner and as supervising physician I was immediately available for consultation/collaboration.  Megan Docherty, MD 05/25/14 1242 

## 2015-09-16 ENCOUNTER — Emergency Department (HOSPITAL_BASED_OUTPATIENT_CLINIC_OR_DEPARTMENT_OTHER): Payer: Medicare Other

## 2015-09-16 ENCOUNTER — Emergency Department (HOSPITAL_BASED_OUTPATIENT_CLINIC_OR_DEPARTMENT_OTHER)
Admission: EM | Admit: 2015-09-16 | Discharge: 2015-09-16 | Disposition: A | Discharge status: Home / Self Care | Payer: Medicare Other | Attending: Emergency Medicine | Admitting: Emergency Medicine

## 2015-09-16 ENCOUNTER — Encounter (HOSPITAL_BASED_OUTPATIENT_CLINIC_OR_DEPARTMENT_OTHER): Payer: Self-pay | Admitting: *Deleted

## 2015-09-16 DIAGNOSIS — Z87891 Personal history of nicotine dependence: Secondary | ICD-10-CM | POA: Diagnosis not present

## 2015-09-16 DIAGNOSIS — Z79899 Other long term (current) drug therapy: Secondary | ICD-10-CM | POA: Diagnosis not present

## 2015-09-16 DIAGNOSIS — R1031 Right lower quadrant pain: Secondary | ICD-10-CM | POA: Insufficient documentation

## 2015-09-16 DIAGNOSIS — G40909 Epilepsy, unspecified, not intractable, without status epilepticus: Secondary | ICD-10-CM | POA: Insufficient documentation

## 2015-09-16 DIAGNOSIS — Z791 Long term (current) use of non-steroidal anti-inflammatories (NSAID): Secondary | ICD-10-CM | POA: Insufficient documentation

## 2015-09-16 DIAGNOSIS — R109 Unspecified abdominal pain: Secondary | ICD-10-CM

## 2015-09-16 LAB — CBC WITH DIFFERENTIAL/PLATELET
BASOS ABS: 0 10*3/uL (ref 0.0–0.1)
BASOS PCT: 0 %
EOS ABS: 0.1 10*3/uL (ref 0.0–0.7)
Eosinophils Relative: 1 %
HCT: 46.1 % (ref 39.0–52.0)
HEMOGLOBIN: 16.3 g/dL (ref 13.0–17.0)
Lymphocytes Relative: 33 %
Lymphs Abs: 1.6 10*3/uL (ref 0.7–4.0)
MCH: 30.1 pg (ref 26.0–34.0)
MCHC: 35.4 g/dL (ref 30.0–36.0)
MCV: 85.1 fL (ref 78.0–100.0)
Monocytes Absolute: 0.4 10*3/uL (ref 0.1–1.0)
Monocytes Relative: 9 %
NEUTROS PCT: 57 %
Neutro Abs: 2.8 10*3/uL (ref 1.7–7.7)
PLATELETS: 202 10*3/uL (ref 150–400)
RBC: 5.42 MIL/uL (ref 4.22–5.81)
RDW: 12.1 % (ref 11.5–15.5)
WBC: 5 10*3/uL (ref 4.0–10.5)

## 2015-09-16 LAB — COMPREHENSIVE METABOLIC PANEL
ALBUMIN: 4 g/dL (ref 3.5–5.0)
ALT: 18 U/L (ref 17–63)
AST: 18 U/L (ref 15–41)
Alkaline Phosphatase: 80 U/L (ref 38–126)
Anion gap: 7 (ref 5–15)
BUN: 19 mg/dL (ref 6–20)
CHLORIDE: 104 mmol/L (ref 101–111)
CO2: 25 mmol/L (ref 22–32)
Calcium: 9 mg/dL (ref 8.9–10.3)
Creatinine, Ser: 0.91 mg/dL (ref 0.61–1.24)
GFR calc non Af Amer: >60 mL/min (ref >60)
Glucose, Bld: 100 mg/dL — ABNORMAL HIGH (ref 65–99)
Potassium: 4.2 mmol/L (ref 3.5–5.1)
Sodium: 136 mmol/L (ref 135–145)
Total Bilirubin: 1 mg/dL (ref 0.3–1.2)
Total Protein: 6.8 g/dL (ref 6.5–8.1)

## 2015-09-16 LAB — URINALYSIS, ROUTINE W REFLEX MICROSCOPIC
Bilirubin Urine: NEGATIVE
Glucose, UA: NEGATIVE mg/dL
HGB URINE DIPSTICK: NEGATIVE
Ketones, ur: NEGATIVE mg/dL
Leukocytes, UA: NEGATIVE
NITRITE: NEGATIVE
PROTEIN: NEGATIVE mg/dL
Specific Gravity, Urine: 1.026 (ref 1.005–1.030)
pH: 5 (ref 5.0–8.0)

## 2015-09-16 LAB — LIPASE, BLOOD: Lipase: 33 U/L (ref 11–51)

## 2015-09-16 MED ORDER — MORPHINE SULFATE (PF) 4 MG/ML IV SOLN
4.0000 mg | Freq: Once | INTRAVENOUS | Status: AC
  Administered 2015-09-16: 4 mg via INTRAVENOUS
  Filled 2015-09-16: qty 1

## 2015-09-16 MED ORDER — ONDANSETRON HCL 4 MG/2ML IJ SOLN
4.0000 mg | Freq: Once | INTRAMUSCULAR | Status: AC
  Administered 2015-09-16: 4 mg via INTRAVENOUS
  Filled 2015-09-16: qty 2

## 2015-09-16 NOTE — ED Notes (Signed)
Right flank pain. A week ago he had sharp pain in his groin after moving an object. He felt he pulled a muscle.

## 2015-09-16 NOTE — ED Provider Notes (Signed)
CSN: [id number]     Arrival date & time 09/16/15  1255 History   First MD Initiated Contact with Patient 09/16/15 1317     Chief Complaint  Patient presents with  . Flank Pain     (Consider location/radiation/quality/duration/timing/severity/associated sxs/prior Treatment) HPI  39 year old male with history of multiple sclerosis who presents with abdominal pain and flank pain. States that 1 week ago, he had a sharp pain in his right groin/scrotum, that went away on its own. Over the course of the past few days, he has had persistent intermittent sharp abdominal pain in his right lower quadrant and his right side. It is not associated with nausea, vomiting, diarrhea, melena, hematochezia, dysuria, urinary frequency, or hematuria. States that his pain comes in waves, it is not similar to prior pain episodes that he has had in the past.  Past Medical History  Diagnosis Date  . MS (multiple sclerosis) (HCC)   . Cluster headache   . Epilepsy (HCC)   . Trigeminal neuralgia    Past Surgical History  Procedure Laterality Date  . Upper gastrointestinal endoscopy    . Wisdom tooth extraction     Family History  Problem Relation Age of Onset  . Diabetes Mother   . Hyperlipidemia Mother   . Heart attack Neg Hx   . Hypertension Neg Hx   . Sudden death Neg Hx    Social History  Substance Use Topics  . Smoking status: Former Smoker    Types: Cigarettes  . Smokeless tobacco: Never Used  . Alcohol Use: Yes     Comment: occasionally    Review of Systems 10/14 systems reviewed and are negative other than those stated in the HPI    Allergies  Adhesive  Home Medications   Prior to Admission medications   Medication Sig Start Date End Date Taking? Authorizing Provider  albuterol (PROVENTIL HFA;VENTOLIN HFA) 108 (90 BASE) MCG/ACT inhaler Inhale 2 puffs into the lungs daily as needed for wheezing or shortness of breath.    Historical Provider, MD  albuterol (PROVENTIL HFA;VENTOLIN HFA)  108 (90 BASE) MCG/ACT inhaler Inhale 1-2 puffs into the lungs every 6 (six) hours as needed for wheezing or shortness of breath. 02/14/14   April Palumbo, MD  cyclobenzaprine (FLEXERIL) 10 MG tablet Take 1 tablet (10 mg total) by mouth 3 (three) times daily as needed for muscle spasms. 07/20/13   Susy Frizzle, MD  doxycycline (VIBRAMYCIN) 100 MG capsule Take 1 capsule (100 mg total) by mouth 2 (two) times daily. One po bid x 7 days 01/04/14   Paula Libra, MD  gabapentin (NEURONTIN) 100 MG capsule Take 100 mg by mouth 4 (four) times daily.    Historical Provider, MD  HYDROcodone-acetaminophen (NORCO/VICODIN) 5-325 MG per tablet Take 1-2 tablets by mouth every 6 (six) hours as needed. 05/22/14   Heather Laisure, PA-C  ibuprofen (ADVIL,MOTRIN) 800 MG tablet Take 1 tablet (800 mg total) by mouth 3 (three) times daily. 07/20/13   Susy Frizzle, MD  methocarbamol (ROBAXIN) 500 MG tablet Take 1 tablet (500 mg total) by mouth 2 (two) times daily. 05/22/14   Santiago Glad, PA-C  oxyCODONE-acetaminophen (PERCOCET/ROXICET) 5-325 MG per tablet Take 2 tablets by mouth every 6 (six) hours as needed for pain. 07/21/13   Dorna Leitz, MD  predniSONE (DELTASONE) 20 MG tablet 3 tabs po day one, then 2 po daily x 4 days 02/14/14   April Palumbo, MD  predniSONE (STERAPRED UNI-PAK) 10 MG tablet 6 tabs po days 1-2, 5 tabs  po days 3-4, 4 tabs po days 5-6, 3 tabs po days 7-8, 2 tabs po days 9-10, 1 tab po days 11-12 08/14/13   Lenda Kelp, MD  Vitamin D, Ergocalciferol, (DRISDOL) 50000 UNITS CAPS capsule Take 50,000 Units by mouth every 7 (seven) days. On Wednesday    Historical Provider, MD   BP 111/65 mmHg  Pulse 61  Temp(Src) 97.8 F (36.6 C) (Oral)  Resp 18  Ht 5\' 10"  (1.778 m)  Wt 135 lb (61.236 kg)  BMI 19.37 kg/m2  SpO2 99% Physical Exam Physical Exam  Nursing note and vitals reviewed. Constitutional: Well developed, well nourished, non-toxic, and in no acute distress Head: Normocephalic and atraumatic.   Mouth/Throat: Oropharynx is clear and moist.  Neck: Normal range of motion. Neck supple.  Cardiovascular: Normal rate and regular rhythm.   Pulmonary/Chest: Effort normal and breath sounds normal.  Abdominal: Soft. Non-distended. There is RLQ and right side pain to palpation. There is no rebound and no guarding.  GU: normal external genitalia. Normal testicular lie with normal cremasteric reflexes. Mild reported Tenderness to palpation of right spermatic cord. Musculoskeletal: Normal range of motion.  Neurological: Alert, no facial droop, fluent speech, moves all extremities symmetrically Skin: Skin is warm and dry.  Psychiatric: Cooperative  ED Course  Procedures (including critical care time) Labs Review Labs Reviewed  COMPREHENSIVE METABOLIC PANEL - Abnormal; Notable for the following:    Glucose, Bld 100 (*)    All other components within normal limits  URINALYSIS, ROUTINE W REFLEX MICROSCOPIC (NOT AT [facility])  CBC WITH DIFFERENTIAL/PLATELET  LIPASE, BLOOD    Imaging Review US Scrotum  09/16/2015  CLINICAL DATA:  39 year old with right lower quadrant abdominal and right groin pain for 1.5 weeks. Initial encounter. EXAM: SCROTAL ULTRASOUND DOPPLER ULTRASOUND OF THE TESTICLES TECHNIQUE: Complete ultrasound examination of the testicles, epididymis, and other scrotal structures was performed. Color and spectral Doppler ultrasound were also utilized to evaluate blood flow to the testicles. COMPARISON:  Pelvic CT 05/22/2014. FINDINGS: Right testicle Measurements: 4.4 x 2.3 x 3.0 cm. Microlithiasis noted. No evidence of mass. There is normal blood flow with color Doppler. Left testicle Measurements: 4.3 x 2.3 x 3.1 cm. Microlithiasis noted. No evidence of mass. There is normal blood flow with color Doppler. Right epididymis:  Normal in size and appearance. Left epididymis:  Normal in size and appearance. Hydrocele:  None visualized. Varicocele:  None visualized. Pulsed Doppler interrogation of  both testes demonstrates normal low resistance arterial and venous waveforms bilaterally. Other: Imaging of the right groin demonstrates normal sized lymph nodes with retain fatty hila. There is no obvious hernia. IMPRESSION: 1. No acute findings or explanation for the patient's symptoms. No evidence of testicular torsion. 2. Bilateral testicular microlithiasis. Current literature suggests that testicular microlithiasis is not a significant independent risk factor for development of testicular carcinoma, and that follow up imaging is not warranted in the absence of other risk factors. Monthly testicular self-examination and annual physical exams are considered appropriate surveillance. If patient has other risk factors for testicular carcinoma, then referral to Urology should be considered. (Reference: DeCastro, et al.: A 5-Year Follow up Study of Asymptomatic Men with Testicular Microlithiasis. J Urol 2008; 179:1420-1423.) Electronically Signed   By: Carey Bullocks M.D.   On: 09/16/2015 14:52   [place] Art/ven Flow Abd Pelv Doppler  09/16/2015  CLINICAL DATA:  39 year old with right lower quadrant abdominal and right groin pain for 1.5 weeks. Initial encounter. EXAM: SCROTAL ULTRASOUND DOPPLER ULTRASOUND OF THE TESTICLES TECHNIQUE:  Complete ultrasound examination of the testicles, epididymis, and other scrotal structures was performed. Color and spectral Doppler ultrasound were also utilized to evaluate blood flow to the testicles. COMPARISON:  Pelvic CT 05/22/2014. FINDINGS: Right testicle Measurements: 4.4 x 2.3 x 3.0 cm. Microlithiasis noted. No evidence of mass. There is normal blood flow with color Doppler. Left testicle Measurements: 4.3 x 2.3 x 3.1 cm. Microlithiasis noted. No evidence of mass. There is normal blood flow with color Doppler. Right epididymis:  Normal in size and appearance. Left epididymis:  Normal in size and appearance. Hydrocele:  None visualized. Varicocele:  None visualized. Pulsed  Doppler interrogation of both testes demonstrates normal low resistance arterial and venous waveforms bilaterally. Other: Imaging of the right groin demonstrates normal sized lymph nodes with retain fatty hila. There is no obvious hernia. IMPRESSION: 1. No acute findings or explanation for the patient's symptoms. No evidence of testicular torsion. 2. Bilateral testicular microlithiasis. Current literature suggests that testicular microlithiasis is not a significant independent risk factor for development of testicular carcinoma, and that follow up imaging is not warranted in the absence of other risk factors. Monthly testicular self-examination and annual physical exams are considered appropriate surveillance. If patient has other risk factors for testicular carcinoma, then referral to Urology should be considered. (Reference: DeCastro, et al.: A 5-Year Follow up Study of Asymptomatic Men with Testicular Microlithiasis. J Urol 2008; 179:1420-1423.) Electronically Signed   By: Carey Bullocks M.D.   On: 09/16/2015 14:52   Ct Renal Stone Study  09/16/2015  CLINICAL DATA:  Abdominal/flank pain x1 week, history of MS EXAM: CT ABDOMEN AND PELVIS WITHOUT CONTRAST TECHNIQUE: Multidetector CT imaging of the abdomen and pelvis was performed following the standard protocol without IV contrast. COMPARISON:  05/22/2014 FINDINGS: Lower chest:  Lung bases are clear. Hepatobiliary: Unenhanced liver is unremarkable. Gallbladder is unremarkable. No intrahepatic or extrahepatic ductal dilatation. Pancreas: Within normal limits. Spleen: Within normal limits. Adrenals/Urinary Tract: Adrenal glands are within normal limits. 2 mm calculus in the left upper kidney (series 2/image 20). Right kidney is unremarkable. No ureteral or bladder calculi.  No hydronephrosis. Bladder is within normal limits. Stomach/Bowel: Stomach is within normal limits. No evidence of bowel obstruction. Normal appendix (series 2/image 54). Vascular/Lymphatic:  No evidence of abdominal aortic aneurysm. No suspicious abdominopelvic lymphadenopathy. Reproductive: Prostate is unremarkable. Other: No abdominopelvic ascites. Musculoskeletal: Visualized osseous structures are within normal limits. IMPRESSION: 2 mm nonobstructing left upper pole renal calculus. No ureteral or bladder calculi.  No hydronephrosis. Electronically Signed   By: Charline Bills M.D.   On: 09/16/2015 17:19      I have personally reviewed and evaluated these images and lab results as part of my medical decision-making.    MDM   Final diagnoses:  Flank pain  Right lower quadrant abdominal pain   39 year old male with history of who presents with right side and flank pain. He is well appearing and in no acute distress. VS are not concerning. He has a soft and non-surgical abdomen. Reported right side pain and pain in the RLQ to palpation. Normal external exam of the GU, but with some reported tenderness at the base of the right testicle at the spermatic cord. UA without infection or blood. Unremarkable CBC, CmP, lipase. Initial scrotal ultrasound without acute processes, such as torsion. CT abd/pelvis without renal stones, appendicitis, or other acute intra-abdominal processes. He denied any recent heavy lifting/exertional type of activity to me after multiple times of questioning, but told nurse that while  moving an item, he felt this pain a week ago. Thus, likely more due to muscle strain. Discussed supportive care instructions for home. Strict return and follow-up instructions reviewed. He expressed understanding of all discharge instructions and felt comfortable with the plan of care.    Lavera Guise, MD 09/18/15 1146

## 2015-09-16 NOTE — Discharge Instructions (Signed)
We did not find serious cause of your abdominal pain today. Please follow-up with your primary care provider and neurologist for re-evaluation on Monday. Return for worsening symptoms including worsening pain, fever, vomiting and unable to keep down food/fluids, or any other symptoms concerning to you.  Abdominal Pain, Adult Many things can cause belly (abdominal) pain. Most times, the belly pain is not dangerous. Many cases of belly pain can be watched and treated at home. HOME CARE   Do not take medicines that help you go poop (laxatives) unless told to by your doctor.  Only take medicine as told by your doctor.  Eat or drink as told by your doctor. Your doctor will tell you if you should be on a special diet. GET HELP IF:  You do not know what is causing your belly pain.  You have belly pain while you are sick to your stomach (nauseous) or have runny poop (diarrhea).  You have pain while you pee or poop.  Your belly pain wakes you up at night.  You have belly pain that gets worse or better when you eat.  You have belly pain that gets worse when you eat fatty foods.  You have a fever. GET HELP RIGHT AWAY IF:   The pain does not go away within 2 hours.  You keep throwing up (vomiting).  The pain changes and is only in the right or left part of the belly.  You have bloody or tarry looking poop. MAKE SURE YOU:   Understand these instructions.  Will watch your condition.  Will get help right away if you are not doing well or get worse.   This information is not intended to replace advice given to you by your health care provider. Make sure you discuss any questions you have with your health care provider.   Document Released: 03/19/2008 Document Revised: 10/22/2014 Document Reviewed: 06/10/2013 Elsevier Interactive Patient Education [employer]

## 2015-09-16 NOTE — ED Notes (Signed)
Patient transported to Ultrasound 

## 2015-09-16 NOTE — ED Notes (Signed)
MD at bedside. 

## 2016-04-19 ENCOUNTER — Encounter (HOSPITAL_BASED_OUTPATIENT_CLINIC_OR_DEPARTMENT_OTHER): Payer: Self-pay

## 2016-04-19 ENCOUNTER — Emergency Department (HOSPITAL_BASED_OUTPATIENT_CLINIC_OR_DEPARTMENT_OTHER)
Admission: EM | Admit: 2016-04-19 | Discharge: 2016-04-19 | Disposition: A | Discharge status: Home / Self Care | Payer: Medicare Other | Attending: Dermatology | Admitting: Dermatology

## 2016-04-19 ENCOUNTER — Emergency Department (HOSPITAL_BASED_OUTPATIENT_CLINIC_OR_DEPARTMENT_OTHER): Payer: Medicare Other

## 2016-04-19 DIAGNOSIS — Z79899 Other long term (current) drug therapy: Secondary | ICD-10-CM | POA: Diagnosis not present

## 2016-04-19 DIAGNOSIS — L0201 Cutaneous abscess of face: Secondary | ICD-10-CM

## 2016-04-19 DIAGNOSIS — Z87891 Personal history of nicotine dependence: Secondary | ICD-10-CM | POA: Diagnosis not present

## 2016-04-19 LAB — CBC
HCT: 42.1 % (ref 39.0–52.0)
Hemoglobin: 15 g/dL (ref 13.0–17.0)
MCH: 31.3 pg (ref 26.0–34.0)
MCHC: 35.6 g/dL (ref 30.0–36.0)
MCV: 87.7 fL (ref 78.0–100.0)
Platelets: 180 K/uL (ref 150–400)
RBC: 4.8 MIL/uL (ref 4.22–5.81)
RDW: 12 % (ref 11.5–15.5)
WBC: 5.8 K/uL (ref 4.0–10.5)

## 2016-04-19 LAB — BASIC METABOLIC PANEL WITH GFR
Anion gap: 6 (ref 5–15)
BUN: 14 mg/dL (ref 6–20)
CO2: 26 mmol/L (ref 22–32)
Calcium: 8.8 mg/dL — ABNORMAL LOW (ref 8.9–10.3)
Chloride: 104 mmol/L (ref 101–111)
Creatinine, Ser: 0.85 mg/dL (ref 0.61–1.24)
GFR calc Af Amer: >60 mL/min
GFR calc non Af Amer: >60 mL/min
Glucose, Bld: 85 mg/dL (ref 65–99)
Potassium: 3.7 mmol/L (ref 3.5–5.1)
Sodium: 136 mmol/L (ref 135–145)

## 2016-04-19 LAB — URINALYSIS, ROUTINE W REFLEX MICROSCOPIC
BILIRUBIN URINE: NEGATIVE
Glucose, UA: NEGATIVE mg/dL
HGB URINE DIPSTICK: NEGATIVE
Ketones, ur: NEGATIVE mg/dL
Leukocytes, UA: NEGATIVE
Nitrite: NEGATIVE
PH: 7 (ref 5.0–8.0)
Protein, ur: NEGATIVE mg/dL
SPECIFIC GRAVITY, URINE: 1.016 (ref 1.005–1.030)

## 2016-04-19 LAB — TROPONIN I: Troponin I: <0.03 ng/mL

## 2016-04-19 MED ORDER — LIDOCAINE HCL (PF) 1 % IJ SOLN
30.0000 mL | Freq: Once | INTRAMUSCULAR | Status: AC
  Administered 2016-04-19: 30 mL
  Filled 2016-04-19: qty 30

## 2016-04-19 MED ORDER — SULFAMETHOXAZOLE-TRIMETHOPRIM 800-160 MG PO TABS: 1.0000 | ORAL_TABLET | Freq: Two times a day (BID) | ORAL | Status: AC

## 2016-04-19 MED ORDER — OXYCODONE-ACETAMINOPHEN 5-325 MG PO TABS: 2.0000 | ORAL_TABLET | ORAL | Status: DC | PRN

## 2016-04-19 MED ORDER — MORPHINE SULFATE (PF) 4 MG/ML IV SOLN
4.0000 mg | Freq: Once | INTRAVENOUS | Status: AC
  Administered 2016-04-19: 4 mg via INTRAVENOUS
  Filled 2016-04-19: qty 1

## 2016-04-19 MED ORDER — ONDANSETRON 4 MG PO TBDP: 4.0000 mg | ORAL_TABLET | Freq: Three times a day (TID) | ORAL | Status: DC | PRN

## 2016-04-19 MED ORDER — MORPHINE SULFATE (PF) 2 MG/ML IV SOLN
2.0000 mg | Freq: Once | INTRAVENOUS | Status: AC
  Administered 2016-04-19: 2 mg via INTRAVENOUS
  Filled 2016-04-19: qty 1

## 2016-04-19 MED FILL — SULFAMETHOXAZOLE-TMP DS TAB: 800-160 | 7 days supply | Qty: 14 | Fill #0

## 2016-04-19 MED FILL — OXYCODONE/APAP 5-325: 5-325 | 1 days supply | Qty: 6 | Fill #0

## 2016-04-19 NOTE — ED Notes (Signed)
Cyst to left side of face since Saturday-[facility]-started on [facility]x-now nausea, increase pain- pain to chest and left arm this am-NAD

## 2016-04-19 NOTE — ED Notes (Signed)
Pt on the monitor 

## 2016-04-19 NOTE — Discharge Instructions (Signed)
Abscess °An abscess is an infected area that contains a collection of pus and debris. It can occur in almost any part of the body. An abscess is also known as a furuncle or boil. °CAUSES  °An abscess occurs when tissue gets infected. This can occur from blockage of oil or sweat glands, infection of hair follicles, or a minor injury to the skin. As the body tries to fight the infection, pus collects in the area and creates pressure under the skin. This pressure causes pain. People with weakened immune systems have difficulty fighting infections and get certain abscesses more often.  °SYMPTOMS °Usually an abscess develops on the skin and becomes a painful mass that is red, warm, and tender. If the abscess forms under the skin, you may feel a moveable soft area under the skin. Some abscesses break open (rupture) on their own, but most will continue to get worse without care. The infection can spread deeper into the body and eventually into the bloodstream, causing you to feel ill.  °DIAGNOSIS  °Your caregiver will take your medical history and perform a physical exam. A sample of fluid may also be taken from the abscess to determine what is causing your infection. °TREATMENT  °Your caregiver may prescribe antibiotic medicines to fight the infection. However, taking antibiotics alone usually does not cure an abscess. Your caregiver may need to make a small cut (incision) in the abscess to drain the pus. In some cases, gauze is packed into the abscess to reduce pain and to continue draining the area. °HOME CARE INSTRUCTIONS  °· Only take over-the-counter or prescription medicines for pain, discomfort, or fever as directed by your caregiver. °· If you were prescribed antibiotics, take them as directed. Finish them even if you start to feel better. °· If gauze is used, follow your caregiver's directions for changing the gauze. °· To avoid spreading the infection: °· Keep your draining abscess covered with a  bandage. °· Wash your hands well. °· Do not share personal care items, towels, or whirlpools with others. °· Avoid skin contact with others. °· Keep your skin and clothes clean around the abscess. °· Keep all follow-up appointments as directed by your caregiver. °SEEK MEDICAL CARE IF:  °· You have increased pain, swelling, redness, fluid drainage, or bleeding. °· You have muscle aches, chills, or a general ill feeling. °· You have a fever. °MAKE SURE YOU:  °· Understand these instructions. °· Will watch your condition. °· Will get help right away if you are not doing well or get worse. °  °This information is not intended to replace advice given to you by your health care provider. Make sure you discuss any questions you have with your health care provider. °  °Document Released: 07/11/2005 Document Revised: 04/01/2012 Document Reviewed: 12/14/2011 °Elsevier Interactive Patient Education ©2016 [employer] ° °Incision and Drainage °Incision and drainage is a procedure in which a sac-like structure (cystic structure) is opened and drained. The area to be drained usually contains material such as pus, fluid, or blood.  °LET YOUR CAREGIVER KNOW ABOUT:  °· Allergies to medicine. °· Medicines taken, including vitamins, herbs, eyedrops, over-the-counter medicines, and creams. °· Use of steroids (by mouth or creams). °· Previous problems with anesthetics or numbing medicines. °· History of bleeding problems or blood clots. °· Previous surgery. °· Other health problems, including diabetes and kidney problems. °· Possibility of pregnancy, if this applies. °RISKS AND COMPLICATIONS °· Pain. °· Bleeding. °· Scarring. °· Infection. °BEFORE THE PROCEDURE  °  You may need to have an ultrasound or other imaging tests to see how large or deep your cystic structure is. Blood tests may also be used to determine if you have an infection or how severe the infection is. You may need to have a tetanus shot. PROCEDURE  The affected area  is cleaned with a cleaning fluid. The cyst area will then be numbed with a medicine (local anesthetic). A small incision will be made in the cystic structure. A syringe or catheter may be used to drain the contents of the cystic structure, or the contents may be squeezed out. The area will then be flushed with a cleansing solution. After cleansing the area, it is often gently packed with a gauze or another wound dressing. Once it is packed, it will be covered with gauze and tape or some other type of wound dressing. AFTER THE PROCEDURE   Often, you will be allowed to go home right after the procedure.  You may be given antibiotic medicine to prevent or heal an infection.  If the area was packed with gauze or some other wound dressing, you will likely need to come back in 1 to 2 days to get it removed.  The area should heal in about 14 days.   This information is not intended to replace advice given to you by your health care provider. Make sure you discuss any questions you have with your health care provider.   Follow up with your primary care provider next week for a wound-recheck. Take antibiotics as prescribed. Take zofran as needed for nausea, take this prior to taking antibiotics. Take pain medications as needed. Return to the ED if you experience fevers, chills, increased redness or swelling around your wound, chest pain, difficulty breathing.

## 2016-04-21 NOTE — ED Provider Notes (Signed)
CSN: [id number]     Arrival date & time 04/19/16  1327 History   First MD Initiated Contact with Patient 04/19/16 1336     Chief Complaint  Patient presents with  . Cyst  . Chest Pain     (Consider location/radiation/quality/duration/timing/severity/associated sxs/prior Treatment) HPI   Jon Reeves is a 40 year old male with a past medical history of multiple sclerosis, trigeminal neuralgia, epilepsy who presents to the ED today complaining of abscess to his face with associated chest pain. Patient reports that he has had a cyst on the left side of his face for many years. For the last week it has become significantly larger, become tender and erythematous. [facility] and was placed on Keflex. Patient has had 5 doses of that antibiotic and has since developed significant nausea. He reports no improvement in the infection on the side of his face. Patient now states that he is experiencing throbbing sensation in his left jaw as well as in his chest. Patient does report ongoing intermittent chest pains for many years and this feels similar to that. He denies any fevers, chills, shortness of breath, paresthesias. Patient also has significant trigeminal neuralgia which she takes Tegretol for.  Past Medical History  Diagnosis Date  . MS (multiple sclerosis) (HCC)   . Cluster headache   . Epilepsy (HCC)   . Trigeminal neuralgia    Past Surgical History  Procedure Laterality Date  . Upper gastrointestinal endoscopy    . Wisdom tooth extraction     Family History  Problem Relation Age of Onset  . Diabetes Mother   . Hyperlipidemia Mother   . Heart attack Neg Hx   . Hypertension Neg Hx   . Sudden death Neg Hx    Social History  Substance Use Topics  . Smoking status: Former Smoker    Types: Cigarettes  . Smokeless tobacco: Never Used  . Alcohol Use: No    Review of Systems  All other systems reviewed and are negative.     Allergies   Adhesive  Home Medications   Prior to Admission medications   Medication Sig Start Date End Date Taking? Authorizing Provider  Cephalexin (KEFLEX PO) Take by mouth.   Yes Historical Provider, MD  GABAPENTIN PO Take by mouth.   Yes Historical Provider, MD  ondansetron (ZOFRAN ODT) 4 MG disintegrating tablet Take 1 tablet (4 mg total) by mouth every 8 (eight) hours as needed for nausea or vomiting. 04/19/16   Samantha Tripp Dowless, PA-C  oxyCODONE-acetaminophen (PERCOCET/ROXICET) 5-325 MG tablet Take 2 tablets by mouth every 4 (four) hours as needed for severe pain. 04/19/16   Samantha Tripp Dowless, PA-C  sulfamethoxazole-trimethoprim (BACTRIM DS,SEPTRA DS) 800-160 MG tablet Take 1 tablet by mouth 2 (two) times daily. 04/19/16 04/26/16  Samantha Tripp Dowless, PA-C   BP 113/73 mmHg  Pulse 59  Temp(Src) 98.5 F (36.9 C) (Oral)  Resp 15  Ht 5\' 10"  (1.778 m)  Wt 62.596 kg  BMI 19.80 kg/m2  SpO2 94% Physical Exam  Constitutional: He is oriented to person, place, and time. He appears well-developed and well-nourished. No distress.  HENT:  Head: Normocephalic and atraumatic.  Mouth/Throat: No oropharyngeal exudate.  Eyes: Conjunctivae and EOM are normal. Pupils are equal, round, and reactive to light. Right eye exhibits no discharge. Left eye exhibits no discharge. No scleral icterus.  Cardiovascular: Normal rate, regular rhythm, normal heart sounds and intact distal pulses.  Exam reveals no gallop and no friction  rub.   No murmur heard. Pulmonary/Chest: Effort normal and breath sounds normal. No respiratory distress. He has no wheezes. He has no rales. He exhibits no tenderness.  Abdominal: Soft. He exhibits no distension. There is no tenderness. There is no guarding.  Musculoskeletal: Normal range of motion. He exhibits no edema.  Neurological: He is alert and oriented to person, place, and time.  Skin: Skin is warm and dry. No rash noted. He is not diaphoretic. No erythema. No pallor.  2 cm  x 2 cm abscess along left mandible. Area is fluctuant and erythematous. No active drainage or induration. No mandibular tenderness. Abscess is very superficial.  Psychiatric: He has a normal mood and affect. His behavior is normal.  Nursing note and vitals reviewed.   ED Course  Procedures (including critical care time)  INCISION AND DRAINAGE Performed by: Dub Mikes Consent: Verbal consent obtained. Risks and benefits: risks, benefits and alternatives were discussed Type: abscess  Body area: Left jaw  Anesthesia: local infiltration  Incision was made with a scalpel.  Local anesthetic: lidocaine 1 % without epinephrine  Anesthetic total: 4 ml  Complexity: complex Blunt dissection to break up loculations  Drainage: purulent  Drainage amount: Copious    Patient tolerance: Patient tolerated the procedure well with no immediate complications.    Labs Review Labs Reviewed  BASIC METABOLIC PANEL - Abnormal; Notable for the following:    Calcium 8.8 (*)    All other components within normal limits  CBC  TROPONIN I  URINALYSIS, ROUTINE W REFLEX MICROSCOPIC (NOT AT [facility])    Imaging Review Dg Chest Port 1 View  04/19/2016  CLINICAL DATA:  Chest pain EXAM: PORTABLE CHEST 1 VIEW COMPARISON:  Feb 14, 2014 FINDINGS: The interstitium is diffusely thickened. There is no edema or consolidation. Heart size and pulmonary vascularity are normal. No adenopathy. No bone lesions. IMPRESSION: Diffuse interstitial thickening is present. This finding is more prominent currently than on prior study from 2 years prior. Etiology uncertain. There is no frank edema or consolidation. Cardiac silhouette within normal limits. Electronically Signed   By: Bretta Bang III M.D.   On: 04/19/2016 14:47   I have personally reviewed and evaluated these images and lab results as part of my medical decision-making.   EKG Interpretation   Date/Time:  Thursday April 19 2016 14:03:06  EDT Ventricular Rate:  74 PR Interval:    QRS Duration: 79 QT Interval:  354 QTC Calculation: 393 R Axis:   86 Text Interpretation:  Sinus rhythm Minimal ST elevation, anterior leads No  significant change since last tracing Confirmed by LIU MD, Annabelle Harman ([id number]) on  04/19/2016 2:24:04 PM Also confirmed by LIU MD, DANA [phone]), editor Whitney Post,  [place] [phone])  on 04/19/2016 2:25:26 PM      MDM   Final diagnoses:  Abscess of face    Patient with history of multiple sclerosis, trigeminal neuralgia presents to the ED complaining of abscess to the left side of his face and intermittent throbbing chest pain. Patient appears well in the ED, in no apparent distress. Patient reports history of similar chest pain for many years. EKG unremarkable. Initial troponin 0.0. Patient is low risk heart score. Low suspicion ACS. He has PE RC negative. Doubt PE. All other lab work within normal limits. Abscess along left mandible is extremely superficial. Abscess was incised and drained in the ED today. Patient tolerated this procedure well we'll change patient's antibiotics to Bactrim as he was intolerant to Keflex. Bactrim also  provide additional MRSA coverage area will also give Zofran as needed for nausea as Bactrim may also make him feel nauseated. He will follow up for a wound recheck in 3 days. Prior to discharge, patient's chest pain is resolved. He is hemodynamically stable and ready for discharge. Return precautions outlined in patient discharge instructions.    Lester Kinsman [place], PA-C 04/21/16 1246  Lavera Guise, MD 04/24/16 [phone]

## 2017-05-31 ENCOUNTER — Emergency Department (HOSPITAL_BASED_OUTPATIENT_CLINIC_OR_DEPARTMENT_OTHER): Payer: Medicare Other

## 2017-05-31 ENCOUNTER — Emergency Department (HOSPITAL_BASED_OUTPATIENT_CLINIC_OR_DEPARTMENT_OTHER)
Admission: EM | Admit: 2017-05-31 | Discharge: 2017-06-01 | Disposition: A | Discharge status: Home / Self Care | Payer: Medicare Other | Attending: Emergency Medicine | Admitting: Emergency Medicine

## 2017-05-31 ENCOUNTER — Encounter (HOSPITAL_BASED_OUTPATIENT_CLINIC_OR_DEPARTMENT_OTHER): Payer: Self-pay

## 2017-05-31 DIAGNOSIS — F17210 Nicotine dependence, cigarettes, uncomplicated: Secondary | ICD-10-CM | POA: Diagnosis not present

## 2017-05-31 DIAGNOSIS — Z79899 Other long term (current) drug therapy: Secondary | ICD-10-CM | POA: Diagnosis not present

## 2017-05-31 DIAGNOSIS — K8050 Calculus of bile duct without cholangitis or cholecystitis without obstruction: Secondary | ICD-10-CM

## 2017-05-31 DIAGNOSIS — R1011 Right upper quadrant pain: Secondary | ICD-10-CM

## 2017-05-31 DIAGNOSIS — K839 Disease of biliary tract, unspecified: Secondary | ICD-10-CM | POA: Insufficient documentation

## 2017-05-31 LAB — COMPREHENSIVE METABOLIC PANEL
ALBUMIN: 3.7 g/dL (ref 3.5–5.0)
ALT: 19 U/L (ref 17–63)
ANION GAP: 8 (ref 5–15)
AST: 21 U/L (ref 15–41)
Alkaline Phosphatase: 68 U/L (ref 38–126)
BUN: 14 mg/dL (ref 6–20)
CHLORIDE: 102 mmol/L (ref 101–111)
CO2: 25 mmol/L (ref 22–32)
CREATININE: 1.01 mg/dL (ref 0.61–1.24)
Calcium: 8.7 mg/dL — ABNORMAL LOW (ref 8.9–10.3)
Glucose, Bld: 108 mg/dL — ABNORMAL HIGH (ref 65–99)
Potassium: 3.5 mmol/L (ref 3.5–5.1)
SODIUM: 135 mmol/L (ref 135–145)
Total Bilirubin: 0.9 mg/dL (ref 0.3–1.2)
Total Protein: 6.1 g/dL — ABNORMAL LOW (ref 6.5–8.1)

## 2017-05-31 LAB — CBC
HCT: 44.4 % (ref 39.0–52.0)
HEMOGLOBIN: 15.9 g/dL (ref 13.0–17.0)
MCH: 30.9 pg (ref 26.0–34.0)
MCHC: 35.8 g/dL (ref 30.0–36.0)
MCV: 86.2 fL (ref 78.0–100.0)
PLATELETS: 193 10*3/uL (ref 150–400)
RBC: 5.15 MIL/uL (ref 4.22–5.81)
RDW: 11.9 % (ref 11.5–15.5)
WBC: 6.9 10*3/uL (ref 4.0–10.5)

## 2017-05-31 LAB — LIPASE, BLOOD: Lipase: 32 U/L (ref 11–51)

## 2017-05-31 MED ORDER — ACETAMINOPHEN 325 MG PO TABS
650.0000 mg | ORAL_TABLET | Freq: Once | ORAL | Status: AC
  Administered 2017-05-31: 650 mg via ORAL
  Filled 2017-05-31: qty 2

## 2017-05-31 NOTE — ED Notes (Signed)
Pt taken to US

## 2017-05-31 NOTE — ED Triage Notes (Addendum)
C/o RUQ pain-started approx 4pm today after eating-back pain approx 615pm-had chest pain last night earlier-nausea-hx of "gallbladder sludge" 10+ years

## 2017-05-31 NOTE — ED Provider Notes (Signed)
[facility]   CSN: [id number] Arrival date & time: 05/31/17  2058     History   Chief Complaint Chief Complaint  Patient presents with  . Abdominal Pain    HPI Jon Reeves is a 41 y.o. male.  Patient with history of GBD. Recurrent RUQ pain, with latest episode onset about 4 pm today along with nausea.  Pain intensity has improved since onset.   The history is provided by the patient. No language interpreter was used.  Abdominal Pain   This is a recurrent problem. The problem occurs every several days. The problem has been gradually worsening. The pain is associated with eating. The pain is located in the RUQ. The pain is mild. Associated symptoms include belching and nausea. Pertinent negatives include fever. The symptoms are aggravated by eating. Past workup includes ultrasound.    Past Medical History:  Diagnosis Date  . Cluster headache   . Epilepsy (HCC)   . MS (multiple sclerosis) (HCC)   . Trigeminal neuralgia     Patient Active Problem List   Diagnosis Date Noted  . Back pain 08/18/2013  . Neck pain 08/18/2013  . Left shoulder pain 01/15/2013    Past Surgical History:  Procedure Laterality Date  . UPPER GASTROINTESTINAL ENDOSCOPY    . WISDOM TOOTH EXTRACTION         Home Medications    Prior to Admission medications   Medication Sig Start Date End Date Taking? Authorizing Provider  BACLOFEN PO Take by mouth.   Yes [provider]  CarBAMazepine (TEGRETOL PO) Take by mouth.   Yes [provider]  GABAPENTIN PO Take by mouth.    [provider]    Family History Family History  Problem Relation Age of Onset  . Diabetes Mother   . Hyperlipidemia Mother   . Heart attack Neg Hx   . Hypertension Neg Hx   . Sudden death Neg Hx     Social History Social History  Substance Use Topics  . Smoking status: Current Some Day Smoker    Types: Cigarettes  . Smokeless tobacco: Never Used  .  Alcohol use No     Allergies   Adhesive [tape]   Review of Systems Review of Systems  Constitutional: Negative for fever.  Gastrointestinal: Positive for abdominal pain and nausea.  Skin: Negative for rash.  All other systems reviewed and are negative.    Physical Exam Updated Vital Signs BP 115/86 (BP Location: Left Arm)   Pulse 89   Temp 98.4 F (36.9 C) (Oral)   Resp 18   Ht 5\' 10"  (1.778 m)   Wt 62.8 kg (138 lb 7.2 oz)   SpO2 100%   BMI 19.87 kg/m   Physical Exam  Constitutional: He is oriented to person, place, and time. He appears well-developed and well-nourished.  HENT:  Head: Normocephalic.  Eyes: Conjunctivae are normal.  Neck: Neck supple.  Cardiovascular: Normal rate and regular rhythm.   Pulmonary/Chest: Effort normal and breath sounds normal.  Abdominal: Soft. Bowel sounds are normal. He exhibits no distension. There is tenderness in the right upper quadrant. There is no guarding.    Musculoskeletal: Normal range of motion.  Neurological: He is alert and oriented to person, place, and time.  Skin: Skin is warm and dry.  Psychiatric: He has a normal mood and affect.  Nursing note and vitals reviewed.    ED Treatments / Results  Labs (all labs ordered are listed, but only  abnormal results are displayed) Labs Reviewed  COMPREHENSIVE METABOLIC PANEL - Abnormal; Notable for the following:       Result Value   Glucose, Bld 108 (*)    Calcium 8.7 (*)    Total Protein 6.1 (*)    All other components within normal limits  LIPASE, BLOOD  CBC  URINALYSIS, ROUTINE W REFLEX MICROSCOPIC    EKG  EKG Interpretation None       Radiology US Abdomen Limited Ruq  Result Date: 05/31/2017 CLINICAL DATA:  Upper quadrant abdominal pain EXAM: ULTRASOUND ABDOMEN LIMITED RIGHT UPPER QUADRANT COMPARISON:  CT 09/16/2015 FINDINGS: Gallbladder: Very contracted gallbladder with wall thickening up to 3.5 mm. Possible small amount of echogenic material/ sludge  within the gallbladder lumen. Sonographer reports positive sonographic Murphy. Common bile duct: Diameter: 2 mm Liver: No focal lesion identified. Within normal limits in parenchymal echogenicity. IMPRESSION: Very contracted gallbladder which limits the study. Possible small amount of intraluminal sludge. Gallbladder wall is thickened but this is likely augmented by contracted state. Positive sonographic Murphy's. HIDA scan could be helpful for further evaluation to evaluate for acute cholecystitis. Negative for biliary dilatation. Electronically Signed   By: Jasmine Pang M.D.   On: 05/31/2017 22:31    Procedures Procedures (including critical care time)  Medications Ordered in ED Medications  acetaminophen (TYLENOL) tablet 650 mg (650 mg Oral Given 05/31/17 2324)     Initial Impression / Assessment and Plan / ED Course  I have reviewed the triage vital signs and the nursing notes.  Pertinent labs & imaging results that were available during my care of the patient were reviewed by me and considered in my medical decision making (see chart for details).    Patient also reporting headache while in ED.  History of cluster headaches. Patient given tylenol and 100% O2 therapy with resolution of headache.   Patient is nontoxic, nonseptic appearing.  Patient's pain and other symptoms adequately managed in emergency department.  Labs, imaging and vitals reviewed.  Patient does not meet the SIRS or Sepsis criteria.  On repeat exam patient does not have a surgical abdomen and there are no peritoneal signs.  US reveals GB wall thickening. Likely biliary colic. Patient discharged home with symptomatic treatment and given strict instructions for follow-up with their primary care physician and surgery.  I have also discussed reasons to return immediately to the ER.  Patient expresses understanding and agrees with plan.    Final Clinical Impressions(s) / ED Diagnoses   Final diagnoses:  Biliary colic     New Prescriptions New Prescriptions   ONDANSETRON (ZOFRAN ODT) 4 MG DISINTEGRATING TABLET    4mg  ODT q4 hours prn nausea/vomit   OXYCODONE-ACETAMINOPHEN (PERCOCET) 5-325 MG TABLET    Take 1 tablet by mouth every 6 (six) hours as needed for severe pain.     Felicie Morn, NP 06/01/17 [id number]    Doug Sou, MD 06/01/17 1537

## 2017-05-31 NOTE — ED Provider Notes (Signed)
Patient developed right upper quadrant pain after eating pizza earlier tonight. Pain is mild presently. Patient developed a headache since, severe and frontal while here. Similar to headaches. Cluster headache she's had in the past. On exam patient is alert appears uncomfortable HEENT exam no facial asymmetry abdomen minimally tender at right upper quadrant without guarding rigidity or rebound. Neurologic moves all extremities well cranial nerves II through XII grossly intact. Headache much improved after treatment with Tylenol and oxygen   Doug Sou, MD 06/01/17 1610

## 2017-05-31 NOTE — ED Notes (Signed)
Pt reports HA-hx of cluster HAs, pt placed on NRB at 15L per EDP

## 2017-06-01 MED ORDER — ONDANSETRON 4 MG PO TBDP: ORAL_TABLET | ORAL | 0 refills | Status: DC

## 2017-06-01 MED ORDER — OXYCODONE-ACETAMINOPHEN 5-325 MG PO TABS: 1.0000 | ORAL_TABLET | Freq: Four times a day (QID) | ORAL | 0 refills | Status: DC | PRN

## 2017-06-04 ENCOUNTER — Ambulatory Visit: Payer: Self-pay | Admitting: General Surgery

## 2017-06-04 NOTE — H&P (Signed)
Jon Reeves 06/04/2017 10:11 AM Location: [facility] Patient #: [id number] DOB: 1976/07/26 Married / Language: [place] / Race: White Male  History of Present Illness Jon Pollack MD; 06/04/2017 10:48 AM) Patient words: New-gallbladder.  The patient is a 41 year old male.   Note:He is referred by Dr. Rennis Reeves because of biliary colic type pain and gallbladder sludge. He's had this type of pain and no gallbladder sludge for about 10 years. However, the frequency of his episodes is increasing. He was in the emergency department recently. Liver function tests and lipase were not elevated. White blood cell count was not elevated. He is interested and gallbladder surgery. He has multiple sclerosis treated at [facility] and this is under good control. There is no history in his family of gallbladder disease.  Past Surgical History Jon Reeves, CMA; 06/04/2017 10:11 AM) Oral Surgery  Diagnostic Studies History Jon Reeves, CMA; 06/04/2017 10:11 AM) Colonoscopy never  Allergies Jon Reeves, CMA; 06/04/2017 10:11 AM) No Known Drug Allergies 06/04/2017  Medication History Jon Reeves, CMA; 06/04/2017 10:12 AM) Oxycodone-Acetaminophen (5-325MG  Tablet, Oral) Active. Ondansetron (4MG  Tablet Disint, Oral) Active. Baclofen (10MG  Tablet, Oral) Active. Gabapentin (100MG  Capsule, Oral) Active. Medications Reconciled  Social History Jon Reeves, CMA; 06/04/2017 10:11 AM) Alcohol use Occasional alcohol use. Caffeine use Carbonated beverages. No drug use Tobacco use Current some day smoker.  Family History Jon Reeves, CMA; 06/04/2017 10:11 AM) Cancer Sister. Migraine Headache Mother. Seizure disorder Sister.  Other Problems Jon Reeves, CMA; 06/04/2017 10:11 AM) Chronic Obstructive Lung Disease     Review of Systems (Jon Reeves CMA; 06/04/2017 10:11 AM) General Present- Fatigue. Not Present- Appetite  Loss, Chills, Fever, Night Sweats, Weight Gain and Weight Loss. Gastrointestinal Present- Abdominal Pain, Bloating, Change in Bowel Habits, Constipation, Excessive gas, Indigestion and Nausea. Not Present- Bloody Stool, Chronic diarrhea, Difficulty Swallowing, Gets full quickly at meals, Hemorrhoids, Rectal Pain and Vomiting.  Vitals (Jon Reeves CMA; 06/04/2017 10:11 AM) 06/04/2017 10:11 AM Weight: 136.8 lb Height: 70in Body Surface Area: 1.78 m Body Mass Index: 19.63 kg/m  Temp.: 98.72F(Oral)  Pulse: 99 (Regular)  BP: 120/82 (Sitting, Left Arm, Standard)      Physical Exam Jon Pollack MD; 06/04/2017 10:50 AM)  The physical exam findings are as follows: Note:GENERAL APPEARANCE: Thin male in NAD. Pleasant and cooperative.  EARS, NOSE, MOUTH THROAT: Jon Reeves/AT external ears: no lesions or deformities external nose: no lesions or deformities hearing: grossly normal lips: moist, no deformities EYES external: conjunctiva, lids, sclerae normal pupils: equal, round glasses: yes  NECK: Supple, no obvious mass or thyroid mass/enlargement, no trachea deviation  CV ascultation: RRR, no murmur extremity edema: no  RESP/CHEST auscultation: breath sounds equal and clear respiratory effort: normal  GASTROINTESTINAL abdomen: Soft, non-tender, non-distended, no masses liver and spleen: not enlarged. hernia: none present scar: none present  MUSCULOSKELETAL station and gait: normal instability: none  LYMPHATIC: No palpable cervical, supraclavicular adenopathy.  SKIN jaundice: none  NEUROLOGIC speech: normal  PSYCHIATRIC alertness and orientation: normal mood/affect/behavior: normal judgement and insight: normal    Assessment & Plan Jon Pollack MD; 06/04/2017 10:44 AM)  GALLBLADDER SLUDGE (K82.8) Impression: He has fairly typical cases of biliary colic after eating fatty or spicy foods. He is had known gallbladder sludge for 10 years by his  report. His symptoms have been progressive and he is interested in cholecystectomy. Multiple sclerosis seems to be under good control. He is followed at [facility] for this.  Plan:  Laparoscopic cholecystectomy and cholangiogram. I have explained the procedure, risks, and aftercare of cholecystectomy. Risks include but are not limited to bleeding, infection, wound problems, anesthesia, diarrhea, bile leak, injury to common bile duct/liver/intestine. He seems to understand and agrees with the plan.  Avel Peace, M.D.

## 2017-06-05 ENCOUNTER — Encounter (HOSPITAL_COMMUNITY): Payer: Self-pay | Admitting: *Deleted

## 2017-06-07 ENCOUNTER — Ambulatory Visit (HOSPITAL_COMMUNITY): Payer: Medicare Other | Admitting: Anesthesiology

## 2017-06-07 ENCOUNTER — Encounter (HOSPITAL_COMMUNITY): Payer: Self-pay | Admitting: *Deleted

## 2017-06-07 ENCOUNTER — Encounter (HOSPITAL_COMMUNITY)
Admission: RE | Disposition: A | Discharge status: Home / Self Care | Payer: Self-pay | Source: Ambulatory Visit | Attending: General Surgery

## 2017-06-07 ENCOUNTER — Ambulatory Visit (HOSPITAL_COMMUNITY): Payer: Medicare Other

## 2017-06-07 ENCOUNTER — Ambulatory Visit (HOSPITAL_COMMUNITY)
Admission: RE | Admit: 2017-06-07 | Discharge: 2017-06-07 | Disposition: A | Discharge status: Home / Self Care | Payer: Medicare Other | Source: Ambulatory Visit | Attending: General Surgery | Admitting: General Surgery

## 2017-06-07 DIAGNOSIS — K8064 Calculus of gallbladder and bile duct with chronic cholecystitis without obstruction: Secondary | ICD-10-CM | POA: Insufficient documentation

## 2017-06-07 DIAGNOSIS — Z79899 Other long term (current) drug therapy: Secondary | ICD-10-CM | POA: Insufficient documentation

## 2017-06-07 DIAGNOSIS — Z419 Encounter for procedure for purposes other than remedying health state, unspecified: Secondary | ICD-10-CM

## 2017-06-07 DIAGNOSIS — G40909 Epilepsy, unspecified, not intractable, without status epilepticus: Secondary | ICD-10-CM | POA: Insufficient documentation

## 2017-06-07 DIAGNOSIS — K828 Other specified diseases of gallbladder: Secondary | ICD-10-CM | POA: Diagnosis present

## 2017-06-07 DIAGNOSIS — G500 Trigeminal neuralgia: Secondary | ICD-10-CM | POA: Insufficient documentation

## 2017-06-07 DIAGNOSIS — J449 Chronic obstructive pulmonary disease, unspecified: Secondary | ICD-10-CM | POA: Insufficient documentation

## 2017-06-07 HISTORY — DX: Nausea with vomiting, unspecified: Z98.890

## 2017-06-07 HISTORY — DX: Nausea with vomiting, unspecified: R11.2

## 2017-06-07 HISTORY — DX: Other complications of anesthesia, initial encounter: T88.59XA

## 2017-06-07 HISTORY — DX: Chronic obstructive pulmonary disease, unspecified: J44.9

## 2017-06-07 HISTORY — DX: Neuralgia and neuritis, unspecified: M79.2

## 2017-06-07 HISTORY — DX: Adverse effect of unspecified anesthetic, initial encounter: T41.45XA

## 2017-06-07 HISTORY — DX: Unspecified convulsions: R56.9

## 2017-06-07 HISTORY — PX: CHOLECYSTECTOMY: SHX55

## 2017-06-07 SURGERY — LAPAROSCOPIC CHOLECYSTECTOMY WITH INTRAOPERATIVE CHOLANGIOGRAM
Anesthesia: General | Site: Abdomen

## 2017-06-07 MED ORDER — OXYCODONE HCL 5 MG PO TABS
5.0000 mg | ORAL_TABLET | ORAL | Status: DC | PRN
  Administered 2017-06-07: 5 mg via ORAL
  Filled 2017-06-07: qty 1

## 2017-06-07 MED ORDER — PHENYLEPHRINE 40 MCG/ML (10ML) SYRINGE FOR IV PUSH (FOR BLOOD PRESSURE SUPPORT)
PREFILLED_SYRINGE | INTRAVENOUS | Status: DC | PRN
  Administered 2017-06-07: 40 ug via INTRAVENOUS
  Administered 2017-06-07: 80 ug via INTRAVENOUS

## 2017-06-07 MED ORDER — ONDANSETRON HCL 4 MG PO TABS: 4.0000 mg | ORAL_TABLET | ORAL | 0 refills | Status: AC | PRN

## 2017-06-07 MED ORDER — KETOROLAC TROMETHAMINE 30 MG/ML IJ SOLN
INTRAMUSCULAR | Status: DC | PRN
  Administered 2017-06-07: 30 mg via INTRAVENOUS

## 2017-06-07 MED ORDER — ACETAMINOPHEN 650 MG RE SUPP
650.0000 mg | RECTAL | Status: DC | PRN
  Filled 2017-06-07: qty 1

## 2017-06-07 MED ORDER — ONDANSETRON HCL 4 MG/2ML IJ SOLN
INTRAMUSCULAR | Status: DC | PRN
  Administered 2017-06-07: 4 mg via INTRAVENOUS

## 2017-06-07 MED ORDER — FENTANYL CITRATE (PF) 250 MCG/5ML IJ SOLN
INTRAMUSCULAR | Status: AC
  Filled 2017-06-07: qty 5

## 2017-06-07 MED ORDER — DEXAMETHASONE SODIUM PHOSPHATE 4 MG/ML IJ SOLN
INTRAMUSCULAR | Status: DC | PRN
  Administered 2017-06-07 (×2): 10 mg via INTRAVENOUS

## 2017-06-07 MED ORDER — SODIUM CHLORIDE 0.9% FLUSH: 3.0000 mL | INTRAVENOUS | Status: DC | PRN

## 2017-06-07 MED ORDER — LIDOCAINE 2% (20 MG/ML) 5 ML SYRINGE
INTRAMUSCULAR | Status: AC
  Filled 2017-06-07: qty 5

## 2017-06-07 MED ORDER — SUGAMMADEX SODIUM 500 MG/5ML IV SOLN
INTRAVENOUS | Status: AC
  Filled 2017-06-07: qty 5

## 2017-06-07 MED ORDER — PROMETHAZINE HCL 25 MG/ML IJ SOLN
INTRAMUSCULAR | Status: AC
  Filled 2017-06-07: qty 1

## 2017-06-07 MED ORDER — FENTANYL CITRATE (PF) 100 MCG/2ML IJ SOLN
INTRAMUSCULAR | Status: AC
  Administered 2017-06-07: 50 ug via INTRAVENOUS
  Filled 2017-06-07: qty 4

## 2017-06-07 MED ORDER — SCOPOLAMINE 1 MG/3DAYS TD PT72
1.0000 | MEDICATED_PATCH | TRANSDERMAL | Status: DC
  Administered 2017-06-07: 1.5 mg via TRANSDERMAL
  Filled 2017-06-07: qty 1

## 2017-06-07 MED ORDER — ROCURONIUM BROMIDE 50 MG/5ML IV SOSY
PREFILLED_SYRINGE | INTRAVENOUS | Status: DC | PRN
  Administered 2017-06-07: 40 mg via INTRAVENOUS
  Administered 2017-06-07: 10 mg via INTRAVENOUS

## 2017-06-07 MED ORDER — 0.9 % SODIUM CHLORIDE (POUR BTL) OPTIME
TOPICAL | Status: DC | PRN
  Administered 2017-06-07: 1000 mL

## 2017-06-07 MED ORDER — CEFAZOLIN SODIUM-DEXTROSE 2-4 GM/100ML-% IV SOLN
2.0000 g | INTRAVENOUS | Status: AC
  Administered 2017-06-07: 2 g via INTRAVENOUS
  Filled 2017-06-07: qty 100

## 2017-06-07 MED ORDER — CHLORHEXIDINE GLUCONATE CLOTH 2 % EX PADS: 6.0000 | MEDICATED_PAD | Freq: Once | CUTANEOUS | Status: DC

## 2017-06-07 MED ORDER — METOCLOPRAMIDE HCL 5 MG/ML IJ SOLN
INTRAMUSCULAR | Status: AC
  Filled 2017-06-07: qty 2

## 2017-06-07 MED ORDER — METHOCARBAMOL 1000 MG/10ML IJ SOLN
500.0000 mg | Freq: Once | INTRAVENOUS | Status: AC
  Administered 2017-06-07: 500 mg via INTRAVENOUS
  Filled 2017-06-07: qty 5

## 2017-06-07 MED ORDER — MIDAZOLAM HCL 2 MG/2ML IJ SOLN
INTRAMUSCULAR | Status: AC
  Filled 2017-06-07: qty 2

## 2017-06-07 MED ORDER — ONDANSETRON HCL 4 MG/2ML IJ SOLN
INTRAMUSCULAR | Status: AC
  Filled 2017-06-07: qty 2

## 2017-06-07 MED ORDER — PHENYLEPHRINE 40 MCG/ML (10ML) SYRINGE FOR IV PUSH (FOR BLOOD PRESSURE SUPPORT)
PREFILLED_SYRINGE | INTRAVENOUS | Status: AC
  Filled 2017-06-07: qty 10

## 2017-06-07 MED ORDER — PROPOFOL 10 MG/ML IV BOLUS
INTRAVENOUS | Status: DC | PRN
  Administered 2017-06-07: 200 mg via INTRAVENOUS

## 2017-06-07 MED ORDER — DIPHENHYDRAMINE HCL 50 MG/ML IJ SOLN
INTRAMUSCULAR | Status: DC | PRN
  Administered 2017-06-07: 12.5 mg via INTRAVENOUS

## 2017-06-07 MED ORDER — ROCURONIUM BROMIDE 50 MG/5ML IV SOSY
PREFILLED_SYRINGE | INTRAVENOUS | Status: AC
  Filled 2017-06-07: qty 5

## 2017-06-07 MED ORDER — SUGAMMADEX SODIUM 200 MG/2ML IV SOLN
INTRAVENOUS | Status: DC | PRN
  Administered 2017-06-07: 250 mg via INTRAVENOUS

## 2017-06-07 MED ORDER — DEXAMETHASONE SODIUM PHOSPHATE 10 MG/ML IJ SOLN
INTRAMUSCULAR | Status: AC
  Filled 2017-06-07: qty 1

## 2017-06-07 MED ORDER — FENTANYL CITRATE (PF) 100 MCG/2ML IJ SOLN
INTRAMUSCULAR | Status: DC | PRN
  Administered 2017-06-07: 100 ug via INTRAVENOUS
  Administered 2017-06-07 (×3): 50 ug via INTRAVENOUS

## 2017-06-07 MED ORDER — BUPIVACAINE-EPINEPHRINE 0.5% -1:200000 IJ SOLN
INTRAMUSCULAR | Status: AC
  Filled 2017-06-07: qty 1

## 2017-06-07 MED ORDER — BUPIVACAINE HCL 0.5 % IJ SOLN
INTRAMUSCULAR | Status: DC | PRN
  Administered 2017-06-07: 13 mL

## 2017-06-07 MED ORDER — OXYCODONE HCL 5 MG PO TABS: 5.0000 mg | ORAL_TABLET | ORAL | 0 refills | Status: DC | PRN

## 2017-06-07 MED ORDER — LACTATED RINGERS IV SOLN
INTRAVENOUS | Status: DC
  Administered 2017-06-07 (×2): via INTRAVENOUS

## 2017-06-07 MED ORDER — KETOROLAC TROMETHAMINE 30 MG/ML IJ SOLN
INTRAMUSCULAR | Status: AC
  Filled 2017-06-07: qty 1

## 2017-06-07 MED ORDER — DEXTROSE IN LACTATED RINGERS 5 % IV SOLN: INTRAVENOUS | Status: DC

## 2017-06-07 MED ORDER — PROMETHAZINE HCL 25 MG/ML IJ SOLN
6.2500 mg | INTRAMUSCULAR | Status: AC | PRN
  Administered 2017-06-07 (×2): 6.25 mg via INTRAVENOUS

## 2017-06-07 MED ORDER — FENTANYL CITRATE (PF) 100 MCG/2ML IJ SOLN
25.0000 ug | INTRAMUSCULAR | Status: DC | PRN
  Administered 2017-06-07 (×4): 50 ug via INTRAVENOUS

## 2017-06-07 MED ORDER — ONDANSETRON HCL 4 MG/2ML IJ SOLN: 4.0000 mg | Freq: Once | INTRAMUSCULAR | Status: DC | PRN

## 2017-06-07 MED ORDER — FENTANYL CITRATE (PF) 100 MCG/2ML IJ SOLN
INTRAMUSCULAR | Status: AC
  Filled 2017-06-07: qty 2

## 2017-06-07 MED ORDER — IOPAMIDOL (ISOVUE-300) INJECTION 61%
INTRAVENOUS | Status: AC
  Filled 2017-06-07: qty 50

## 2017-06-07 MED ORDER — LIDOCAINE 2% (20 MG/ML) 5 ML SYRINGE
INTRAMUSCULAR | Status: DC | PRN
  Administered 2017-06-07: 50 mg via INTRAVENOUS

## 2017-06-07 MED ORDER — PROPOFOL 10 MG/ML IV BOLUS
INTRAVENOUS | Status: AC
  Filled 2017-06-07: qty 20

## 2017-06-07 MED ORDER — LACTATED RINGERS IR SOLN
Status: DC | PRN
  Administered 2017-06-07: 1000 mL

## 2017-06-07 MED ORDER — ACETAMINOPHEN 325 MG PO TABS: 650.0000 mg | ORAL_TABLET | ORAL | Status: DC | PRN

## 2017-06-07 MED ORDER — MEPERIDINE HCL 50 MG/ML IJ SOLN: 6.2500 mg | INTRAMUSCULAR | Status: DC | PRN

## 2017-06-07 SURGICAL SUPPLY — 40 items
APPLIER CLIP 5 13 M/L LIGAMAX5 (MISCELLANEOUS) ×2
APR CLP MED LRG 5 ANG JAW (MISCELLANEOUS) ×1
BAG SPEC RTRVL 10 TROC 200 (ENDOMECHANICALS) ×1
BAG SPEC RTRVL LRG 6X4 10 (ENDOMECHANICALS)
CABLE HIGH FREQUENCY MONO STRZ (ELECTRODE) ×2 IMPLANT
CATH REDDICK CHOLANGI 4FR 50CM (CATHETERS) ×2 IMPLANT
CHLORAPREP W/TINT 26ML (MISCELLANEOUS) ×2 IMPLANT
CLIP APPLIE 5 13 M/L LIGAMAX5 (MISCELLANEOUS) ×1 IMPLANT
COVER MAYO STAND STRL (DRAPES) ×2 IMPLANT
DECANTER SPIKE VIAL GLASS SM (MISCELLANEOUS) ×2 IMPLANT
DISSECTOR BLUNT TIP ENDO 5MM (MISCELLANEOUS) IMPLANT
DRAPE C-ARM 42X120 X-RAY (DRAPES) ×2 IMPLANT
DRSG TEGADERM 2-3/8X2-3/4 SM (GAUZE/BANDAGES/DRESSINGS) ×8 IMPLANT
ELECT REM PT RETURN 15FT ADLT (MISCELLANEOUS) ×2 IMPLANT
ENDOLOOP SUT PDS II  0 18 (SUTURE)
ENDOLOOP SUT PDS II 0 18 (SUTURE) IMPLANT
GAUZE SPONGE 2X2 8PLY STRL LF (GAUZE/BANDAGES/DRESSINGS) ×1 IMPLANT
GLOVE ECLIPSE 8.0 STRL XLNG CF (GLOVE) ×2 IMPLANT
GLOVE INDICATOR 8.0 STRL GRN (GLOVE) ×2 IMPLANT
GOWN STRL REUS W/TWL XL LVL3 (GOWN DISPOSABLE) ×6 IMPLANT
HEMOSTAT SNOW SURGICEL 2X4 (HEMOSTASIS) IMPLANT
IRRIG SUCT STRYKERFLOW 2 WTIP (MISCELLANEOUS) ×2
IRRIGATION SUCT STRKRFLW 2 WTP (MISCELLANEOUS) ×1 IMPLANT
IV CATH 14GX2 1/4 (CATHETERS) ×2 IMPLANT
KIT BASIN OR (CUSTOM PROCEDURE TRAY) ×2 IMPLANT
POUCH RETRIEVAL ECOSAC 10 (ENDOMECHANICALS) IMPLANT
POUCH RETRIEVAL ECOSAC 10MM (ENDOMECHANICALS) ×1
POUCH SPECIMEN RETRIEVAL 10MM (ENDOMECHANICALS) IMPLANT
SCISSORS LAP 5X35 DISP (ENDOMECHANICALS) ×2 IMPLANT
SLEEVE XCEL OPT CAN 5 100 (ENDOMECHANICALS) ×4 IMPLANT
SPONGE GAUZE 2X2 STER 10/PKG (GAUZE/BANDAGES/DRESSINGS) ×1
SUT MNCRL AB 4-0 PS2 18 (SUTURE) ×2 IMPLANT
TAPE PAPER 3X10 WHT MICROPORE (GAUZE/BANDAGES/DRESSINGS) ×1 IMPLANT
TOWEL OR 17X26 10 PK STRL BLUE (TOWEL DISPOSABLE) ×2 IMPLANT
TOWEL OR NON WOVEN STRL DISP B (DISPOSABLE) IMPLANT
TRAY LAPAROSCOPIC (CUSTOM PROCEDURE TRAY) ×2 IMPLANT
TROCAR BLADELESS OPT 5 100 (ENDOMECHANICALS) ×2 IMPLANT
TROCAR XCEL BLUNT TIP 100MML (ENDOMECHANICALS) ×2 IMPLANT
TROCAR XCEL NON-BLD 11X100MML (ENDOMECHANICALS) IMPLANT
TUBING INSUF HEATED (TUBING) ×2 IMPLANT

## 2017-06-07 NOTE — Op Note (Signed)
OPERATIVE NOTE-LAPAROSCOPIC CHOLECYSTECTOMY  Preoperative diagnosis: Gallbladder sludge  Postoperative diagnosis: Same  Procedure: Laparoscopic cholecystectomy with cholangiogram.  Surgeon: Avel Peace, M.D.  Asst.: Dr. Claud Kelp MD  Indication for assistant: Provide adequate exposure and increased safety of operation  Anesthesia: General  EBL: Less than 100 cc  Findings: The gallbladder was chronically inflamed.  There were adhesions between the gallbladder and omentum and duodenum.  Indication:   This is a 41 year old male who has classic biliary colic and gallbladder sludge.  He is known about this for 10 years.  He has been had having intermittent episodes of biliary colic for the past 10 years but they have been increasing in frequency.  He was recently in the emergency department.  He now presents for elective cholecystectomy.  Technique: He was brought to the operating room, placed supine on the operating table, and a general anesthetic was administered. The hair on the abdominal wall was clipped as was necessary. The abdominal wall was then sterilely prepped and draped.  A timeout was performed.    Local anesthetic (Marcaine) was infiltrated in the subumbilical region. A small subumbilical incision was made through the skin, subcutaneous tissue, fascia, and peritoneum entering the peritoneal cavity under direct vision. A pursestring suture of 0 Vicryl was placed around the edges of the fascia. A Hassan trocar was introduced into the peritoneal cavity and a pneumoperitoneum was created by insufflation of carbon dioxide gas. The laparoscope was introduced into the trocar and no underlying bleeding or organ injury was noted. He was then placed in the reverse Trendelenburg position with the right side tilted slightly up.  Three 5 mm trocars were then placed into the abdominal cavity under laparoscopic vision. One in the epigastric area, and 2 in the right upper quadrant area.  The gallbladder was visualized and the fundus was grasped and retracted toward the right shoulder.  Adhesions between the body and infundibulum of the gallbladder and omentum and duodenum were separated bluntly and sharply.  The infundibulum was mobilized with dissection close to the gallbladder and retracted laterally. The cystic duct was identified and a window was created around it. The cystic artery was also identified and a window was created around it.  The cystic artery was divided sharply.  The critical view was achieved. A clip was placed at the neck of the gallbladder. A small incision was made in the cystic duct. A cholangiocatheter was introduced through the anterior abdominal wall and placed in the cystic duct. A intraoperative cholangiogram was then performed.  Under real-time fluoroscopy, dilute contrast was injected into the cystic duct.  The common hepatic duct, the right and left hepatic ducts, and the common duct were all visualized. Contrast drained into the duodenum without obvious evidence of any obstructing ductal lesion. The final report is pending the Radiologist's interpretation.  The cholangiocatheter was removed, the cystic duct was clipped 3 times on the biliary side, and then the cystic duct was divided sharply. No bile leak was noted from the cystic duct stump.  Following this the gallbladder was dissected free from the liver using electrocautery. The gallbladder was then placed in a retrieval bag and removed from the abdominal cavity through the subumbilical incision.  The gallbladder fossa was inspected, irrigated, and bleeding was controlled with electrocautery. Inspection showed that hemostasis was adequate and there was no evidence of bile leak.  The irrigation fluid was evacuated as much as possible.  The subumbilical trocar was removed and the fascial defect was closed by tightening  and tying down the pursestring suture under laparoscopic vision.  The remaining  trocars were removed and the pneumoperitoneum was released. The skin incisions were closed with 4-0 Monocryl subcuticular stitches. Steri-Strips and sterile dressings were applied.  The procedure was well-tolerated without any apparent complications. He was taken to the recovery room in satisfactory condition.

## 2017-06-07 NOTE — Discharge Instructions (Signed)
General Anesthesia, Adult, Care After These instructions provide you with information about caring for yourself after your procedure. Your health care provider may also give you more specific instructions. Your treatment has been planned according to current medical practices, but problems sometimes occur. Call your health care provider if you have any problems or questions after your procedure. What can I expect after the procedure? After the procedure, it is common to have:  Vomiting.  A sore throat.  Mental slowness.  It is common to feel:  Nauseous.  Cold or shivery.  Sleepy.  Tired.  Sore or achy, even in parts of your body where you did not have surgery.  Follow these instructions at home: For at least 24 hours after the procedure:  Do not: ? Participate in activities where you could fall or become injured. ? Drive. ? Use heavy machinery. ? Drink alcohol. ? Take sleeping pills or medicines that cause drowsiness. ? Make important decisions or sign legal documents. ? Take care of children on your own.  Rest. Eating and drinking  If you vomit, drink water, juice, or soup when you can drink without vomiting.  Drink enough fluid to keep your urine clear or pale yellow.  Make sure you have little or no nausea before eating solid foods.  Follow the diet recommended by your health care provider. General instructions  Have a responsible adult stay with you until you are awake and alert.  Return to your normal activities as told by your health care provider. Ask your health care provider what activities are safe for you.  Take over-the-counter and prescription medicines only as told by your health care provider.  If you smoke, do not smoke without supervision.  Keep all follow-up visits as told by your health care provider. This is important. Contact a health care provider if:  You continue to have nausea or vomiting at home, and medicines are not helpful.  You  cannot drink fluids or start eating again.  You cannot urinate after 8-12 hours.  You develop a skin rash.  You have fever.  You have increasing redness at the site of your procedure. Get help right away if:  You have difficulty breathing.  You have chest pain.  You have unexpected bleeding.  You feel that you are having a life-threatening or urgent problem. This information is not intended to replace advice given to you by your health care provider. Make sure you discuss any questions you have with your health care provider. Document Released: 01/07/2001 Document Revised: 03/05/2016 Document Reviewed: 09/15/2015 Elsevier Interactive Patient Education  2018 [employer]. [facility]CS ______[facility] LAPAROSCOPIC CHOLECYSTECTOMY SURGERY: POST OP INSTRUCTIONS Always review your discharge instruction sheet given to you by the facility where your surgery was performed. IF YOU HAVE DISABILITY OR FAMILY LEAVE FORMS, YOU MUST BRING THEM TO THE OFFICE FOR PROCESSING.   DO NOT GIVE THEM TO YOUR DOCTOR.  1. A prescription for pain medication may be given to you upon discharge.  Take your pain medication as prescribed, if needed.  If narcotic pain medicine is not needed, then you may take acetaminophen (Tylenol) or ibuprofen (Advil) as needed. 2. Take your usually prescribed medications unless otherwise directed. 3. If you need a refill on your pain medication, please contact your pharmacy.  They will contact our office to request authorization. Prescriptions will not be filled after 5pm or on week-ends. 4. You should follow a liquid diet the first 2 days after arrival home, such as  soup and crackers, etc.  Be sure to include lots of fluids daily.  May start lowfat, solid foods 2 days after the surgery. 5. Most patients will experience some swelling and bruising in the area of the incisions.  Ice packs will help.  Swelling and bruising can take several days to resolve.  6. It is  common to experience some constipation if taking pain medication after surgery.  Increasing fluid intake and taking a stool softener (such as Colace) will usually help or prevent this problem from occurring.  A mild laxative (Milk of Magnesia or Miralax) should be taken according to package instructions if there are no bowel movements after 48 hours. 7. Unless discharge instructions indicate otherwise, you may remove your bandages 72 hours after surgery.  You may shower the day after surgery.  You may have steri-strips (small skin tapes) in place directly over the incision.  These strips should be left on the skin until they fall off.  If your surgeon used skin glue on the incision, you may shower in 24 hours.  The glue will flake off over the next 2-3 weeks.  Any sutures or staples will be removed at the office during your follow-up visit. 8. ACTIVITIES:  You may resume regular (light) daily activities beginning the next day--such as daily self-care, walking, climbing stairs--gradually increasing activities as tolerated.  You may have sexual intercourse when it is comfortable.  Refrain from any heavy lifting or straining for two weeks.  Do not lift anything over 10 pounds during that time.  a. You may drive when you are no longer taking prescription pain medication, you can comfortably wear a seatbelt, and you can safely maneuver your car and apply brakes. b. RETURN TO WORK:  Desk type work in 1 week, full duty work in 2 weeks if you are pain-free.________________________________________________________ 9. You should see your doctor in the office for a follow-up appointment approximately 2-3 weeks after your surgery.  Make sure that you call for this appointment within a day or two after you arrive home to insure a convenient appointment time. 10. OTHER INSTRUCTIONS: __________________________________________________________________________________________________________________________  __________________________________________________________________________________________________________________________ WHEN TO CALL YOUR DOCTOR: 1. Fever over 101.0 2. Inability to urinate 3. Continued bleeding from incision. 4. Increased pain, redness, or drainage from the incision. 5. Increasing abdominal pain  The clinic staff is available to answer your questions during regular business hours.  Please dont hesitate to call and ask to speak to one of the nurses for clinical concerns.  If you have a medical emergency, go to the nearest emergency room or call 911.  A surgeon from [facility] is always on call at the hospital. [address], [place], [place], [place]  [address] ? [address], [place], [place]   [address] [phone] ? [phone] ? FAX [phone] Web site: [email or web address]

## 2017-06-07 NOTE — Anesthesia Preprocedure Evaluation (Addendum)
Anesthesia Evaluation  Patient identified by MRN, date of birth, ID band Patient awake    Reviewed: Allergy & Precautions, NPO status , Patient's Chart, lab work & pertinent test results  History of Anesthesia Complications (+) PONV  Airway Mallampati: II  TM Distance: >3 FB Neck ROM: Full    Dental no notable dental hx.    Pulmonary neg pulmonary ROS, COPD, Current Smoker,    Pulmonary exam normal breath sounds clear to auscultation       Cardiovascular negative cardio ROS Normal cardiovascular exam Rhythm:Regular Rate:Normal     Neuro/Psych  Headaches, Seizures -,  negative neurological ROS  negative psych ROS   GI/Hepatic negative GI ROS, Neg liver ROS,   Endo/Other  negative endocrine ROS  Renal/GU negative Renal ROS  negative genitourinary   Musculoskeletal negative musculoskeletal ROS (+)   Abdominal   Peds negative pediatric ROS (+)  Hematology negative hematology ROS (+)   Anesthesia Other Findings  Cluster headache  . Epilepsy (HCC)  . MS (multiple sclerosis) (HCC)  . Trigeminal neuralgia     Reproductive/Obstetrics negative OB ROS                          Anesthesia Physical Anesthesia Plan  ASA: III  Anesthesia Plan: General   Post-op Pain Management:    Induction: Intravenous  PONV Risk Score and Plan: 2 and Ondansetron, Dexamethasone, Scopolamine patch - Pre-op, Midazolam and Treatment may vary due to age or medical condition  Airway Management Planned: Oral ETT  Additional Equipment:   Intra-op Plan:   Post-operative Plan: Extubation in OR  Informed Consent: I have reviewed the patients History and Physical, chart, labs and discussed the procedure including the risks, benefits and alternatives for the proposed anesthesia with the patient or authorized representative who has indicated his/her understanding and acceptance.     Plan Discussed with:    Anesthesia Plan Comments: (  )       Anesthesia Quick Evaluation

## 2017-06-07 NOTE — H&P (View-Only) (Signed)
Jon Reeves 06/04/2017 10:11 AM Location: [facility] Patient #: [id number] DOB: 1976/07/26 Married / Language: [place] / Race: White Male  History of Present Illness Jon Pollack MD; 06/04/2017 10:48 AM) Patient words: New-gallbladder.  The patient is a 41 year old male.   Note:He is referred by Dr. Rennis Reeves because of biliary colic type pain and gallbladder sludge. He's had this type of pain and no gallbladder sludge for about 10 years. However, the frequency of his episodes is increasing. He was in the emergency department recently. Liver function tests and lipase were not elevated. White blood cell count was not elevated. He is interested and gallbladder surgery. He has multiple sclerosis treated at [facility] and this is under good control. There is no history in his family of gallbladder disease.  Past Surgical History Jon Reeves, CMA; 06/04/2017 10:11 AM) Oral Surgery  Diagnostic Studies History Jon Reeves, CMA; 06/04/2017 10:11 AM) Colonoscopy never  Allergies Jon Reeves, CMA; 06/04/2017 10:11 AM) No Known Drug Allergies 06/04/2017  Medication History Jon Reeves, CMA; 06/04/2017 10:12 AM) Oxycodone-Acetaminophen (5-325MG  Tablet, Oral) Active. Ondansetron (4MG  Tablet Disint, Oral) Active. Baclofen (10MG  Tablet, Oral) Active. Gabapentin (100MG  Capsule, Oral) Active. Medications Reconciled  Social History Jon Reeves, CMA; 06/04/2017 10:11 AM) Alcohol use Occasional alcohol use. Caffeine use Carbonated beverages. No drug use Tobacco use Current some day smoker.  Family History Jon Reeves, CMA; 06/04/2017 10:11 AM) Cancer Sister. Migraine Headache Mother. Seizure disorder Sister.  Other Problems Jon Reeves, CMA; 06/04/2017 10:11 AM) Chronic Obstructive Lung Disease     Review of Systems (Jon Reeves CMA; 06/04/2017 10:11 AM) General Present- Fatigue. Not Present- Appetite  Loss, Chills, Fever, Night Sweats, Weight Gain and Weight Loss. Gastrointestinal Present- Abdominal Pain, Bloating, Change in Bowel Habits, Constipation, Excessive gas, Indigestion and Nausea. Not Present- Bloody Stool, Chronic diarrhea, Difficulty Swallowing, Gets full quickly at meals, Hemorrhoids, Rectal Pain and Vomiting.  Vitals (Jon Reeves CMA; 06/04/2017 10:11 AM) 06/04/2017 10:11 AM Weight: 136.8 lb Height: 70in Body Surface Area: 1.78 m Body Mass Index: 19.63 kg/m  Temp.: 98.72F(Oral)  Pulse: 99 (Regular)  BP: 120/82 (Sitting, Left Arm, Standard)      Physical Exam Jon Pollack MD; 06/04/2017 10:50 AM)  The physical exam findings are as follows: Note:GENERAL APPEARANCE: Thin male in NAD. Pleasant and cooperative.  EARS, NOSE, MOUTH THROAT: Jon Reeves/AT external ears: no lesions or deformities external nose: no lesions or deformities hearing: grossly normal lips: moist, no deformities EYES external: conjunctiva, lids, sclerae normal pupils: equal, round glasses: yes  NECK: Supple, no obvious mass or thyroid mass/enlargement, no trachea deviation  CV ascultation: RRR, no murmur extremity edema: no  RESP/CHEST auscultation: breath sounds equal and clear respiratory effort: normal  GASTROINTESTINAL abdomen: Soft, non-tender, non-distended, no masses liver and spleen: not enlarged. hernia: none present scar: none present  MUSCULOSKELETAL station and gait: normal instability: none  LYMPHATIC: No palpable cervical, supraclavicular adenopathy.  SKIN jaundice: none  NEUROLOGIC speech: normal  PSYCHIATRIC alertness and orientation: normal mood/affect/behavior: normal judgement and insight: normal    Assessment & Plan Jon Pollack MD; 06/04/2017 10:44 AM)  GALLBLADDER SLUDGE (K82.8) Impression: He has fairly typical cases of biliary colic after eating fatty or spicy foods. He is had known gallbladder sludge for 10 years by his  report. His symptoms have been progressive and he is interested in cholecystectomy. Multiple sclerosis seems to be under good control. He is followed at [facility] for this.  Plan:  Laparoscopic cholecystectomy and cholangiogram. I have explained the procedure, risks, and aftercare of cholecystectomy. Risks include but are not limited to bleeding, infection, wound problems, anesthesia, diarrhea, bile leak, injury to common bile duct/liver/intestine. He seems to understand and agrees with the plan.  Avel Peace, M.D.

## 2017-06-07 NOTE — Transfer of Care (Signed)
Immediate Anesthesia Transfer of Care Note  Patient: Jon Reeves  Procedure(s) Performed: Procedure(s): LAPAROSCOPIC CHOLECYSTECTOMY WITH INTRAOPERATIVE CHOLANGIOGRAM (N/A)  Patient Location: PACU  Anesthesia Type:General  Level of Consciousness: Patient easily awoken, sedated, comfortable, cooperative, following commands, responds to stimulation.   Airway & Oxygen Therapy: Patient spontaneously breathing, ventilating well, oxygen via simple oxygen mask.  Post-op Assessment: Report given to PACU RN, vital signs reviewed and stable, moving all extremities.   Post vital signs: Reviewed and stable.  Complications: No apparent anesthesia complications  Last Vitals:  Vitals:   06/07/17 0901 06/07/17 1245  BP: 110/71   Pulse: 78   Resp: 16   Temp: 36.6 C (P) 36.4 C  SpO2: 100%     Last Pain:  Vitals:   06/07/17 1002  TempSrc:   PainSc: 4       Patients Stated Pain Goal: 4 (06/07/17 1004)  Complications: No apparent anesthesia complications

## 2017-06-07 NOTE — Anesthesia Postprocedure Evaluation (Signed)
Anesthesia Post Note  Patient: KAPONE WATZ  Procedure(s) Performed: Procedure(s) (LRB): LAPAROSCOPIC CHOLECYSTECTOMY WITH INTRAOPERATIVE CHOLANGIOGRAM (N/A)     Patient location during evaluation: PACU Anesthesia Type: General Level of consciousness: awake Pain management: pain level controlled Vital Signs Assessment: post-procedure vital signs reviewed and stable Respiratory status: spontaneous breathing Cardiovascular status: stable Postop Assessment: no signs of nausea or vomiting Anesthetic complications: no    Last Vitals:  Vitals:   06/07/17 1400 06/07/17 1409  BP: 101/60 (!) 103/49  Pulse: 69 70  Resp: 15 16  Temp:  36.9 C  SpO2: 98% 100%    Last Pain:  Vitals:   06/07/17 1423  TempSrc:   PainSc: 6    Pain Goal: Patients Stated Pain Goal: 3 (06/07/17 1423)               Kimie Pidcock JR,JOHN Susann Givens

## 2017-06-07 NOTE — Interval H&P Note (Signed)
History and Physical Interval Note:  06/07/2017 10:59 AM  Jon Reeves  has presented today for surgery, with the diagnosis of Gallbladder sludge  The various methods of treatment have been discussed with the patient and family. After consideration of risks, benefits and other options for treatment, the patient has consented to  Procedure(s): LAPAROSCOPIC CHOLECYSTECTOMY WITH INTRAOPERATIVE CHOLANGIOGRAM (N/A) as a surgical intervention .  The patient's history has been reviewed, patient examined, no change in status, stable for surgery.  I have reviewed the patient's chart and labs.  Questions were answered to the patient's satisfaction.     [name] Shela Commons

## 2017-06-07 NOTE — Anesthesia Procedure Notes (Signed)
Procedure Name: Intubation Date/Time: 06/07/2017 11:17 AM Performed by: Anne Fu Pre-anesthesia Checklist: Patient identified, Emergency Drugs available, Suction available, Patient being monitored and Timeout performed Patient Re-evaluated:Patient Re-evaluated prior to induction Oxygen Delivery Method: Circle system utilized Preoxygenation: Pre-oxygenation with 100% oxygen Induction Type: IV induction Ventilation: Mask ventilation without difficulty Laryngoscope Size: Mac and 4 Grade View: Grade I Tube type: Oral Tube size: 7.5 mm Number of attempts: 1 Airway Equipment and Method: Stylet Placement Confirmation: ETT inserted through vocal cords under direct vision,  positive ETCO2 and breath sounds checked- equal and bilateral Secured at: 22 cm Tube secured with: Tape Dental Injury: Teeth and Oropharynx as per pre-operative assessment

## 2017-06-25 ENCOUNTER — Emergency Department (HOSPITAL_BASED_OUTPATIENT_CLINIC_OR_DEPARTMENT_OTHER): Payer: Medicare Other

## 2017-06-25 ENCOUNTER — Emergency Department (HOSPITAL_BASED_OUTPATIENT_CLINIC_OR_DEPARTMENT_OTHER)
Admission: EM | Admit: 2017-06-25 | Discharge: 2017-06-26 | Disposition: A | Discharge status: Home / Self Care | Payer: Medicare Other | Attending: Emergency Medicine | Admitting: Emergency Medicine

## 2017-06-25 ENCOUNTER — Encounter (HOSPITAL_BASED_OUTPATIENT_CLINIC_OR_DEPARTMENT_OTHER): Payer: Self-pay | Admitting: Emergency Medicine

## 2017-06-25 ENCOUNTER — Other Ambulatory Visit (HOSPITAL_BASED_OUTPATIENT_CLINIC_OR_DEPARTMENT_OTHER): Payer: Self-pay | Admitting: Student

## 2017-06-25 DIAGNOSIS — Z9049 Acquired absence of other specified parts of digestive tract: Secondary | ICD-10-CM

## 2017-06-25 DIAGNOSIS — G8918 Other acute postprocedural pain: Secondary | ICD-10-CM | POA: Diagnosis not present

## 2017-06-25 DIAGNOSIS — R109 Unspecified abdominal pain: Secondary | ICD-10-CM

## 2017-06-25 DIAGNOSIS — R1084 Generalized abdominal pain: Secondary | ICD-10-CM

## 2017-06-25 DIAGNOSIS — Z79899 Other long term (current) drug therapy: Secondary | ICD-10-CM | POA: Insufficient documentation

## 2017-06-25 DIAGNOSIS — J449 Chronic obstructive pulmonary disease, unspecified: Secondary | ICD-10-CM | POA: Insufficient documentation

## 2017-06-25 DIAGNOSIS — R110 Nausea: Secondary | ICD-10-CM | POA: Diagnosis not present

## 2017-06-25 DIAGNOSIS — F17200 Nicotine dependence, unspecified, uncomplicated: Secondary | ICD-10-CM | POA: Diagnosis not present

## 2017-06-25 DIAGNOSIS — R1011 Right upper quadrant pain: Secondary | ICD-10-CM | POA: Diagnosis present

## 2017-06-25 LAB — CBC WITH DIFFERENTIAL/PLATELET
BASOS ABS: 0 10*3/uL (ref 0.0–0.1)
BASOS PCT: 1 %
EOS ABS: 0.2 10*3/uL (ref 0.0–0.7)
EOS PCT: 2 %
HCT: 44.9 % (ref 39.0–52.0)
Hemoglobin: 15.9 g/dL (ref 13.0–17.0)
Lymphocytes Relative: 37 %
Lymphs Abs: 2.5 10*3/uL (ref 0.7–4.0)
MCH: 30.8 pg (ref 26.0–34.0)
MCHC: 35.4 g/dL (ref 30.0–36.0)
MCV: 86.8 fL (ref 78.0–100.0)
MONO ABS: 0.6 10*3/uL (ref 0.1–1.0)
Monocytes Relative: 8 %
Neutro Abs: 3.4 10*3/uL (ref 1.7–7.7)
Neutrophils Relative %: 52 %
PLATELETS: 238 10*3/uL (ref 150–400)
RBC: 5.17 MIL/uL (ref 4.22–5.81)
RDW: 12 % (ref 11.5–15.5)
WBC: 6.6 10*3/uL (ref 4.0–10.5)

## 2017-06-25 NOTE — ED Provider Notes (Signed)
[facility] Provider Note   CSN: [id number] Arrival date & time: 06/25/17  2024     History   Chief Complaint Chief Complaint  Patient presents with  . Abdominal Pain  . Nausea    HPI Jon Reeves is a 41 y.o. male.  Patient with history of cholecystectomy performed on 06/07/2017 by Dr. Abbey Chatters -- presents with complaints of worsening abdominal pains. Patient states that after the surgery he felt very good. Over the past several days he has had progressive right upper quadrant pain with chills. No vomiting or diarrhea. No blood noted in the stool. No chest pain or shortness of breath. Patient was seen by surgery PA today who ordered blood work. He does not know the results. Patient states that the pain is becoming severe at times. It is worse with palpation and movement. Nothing seems to make it better. Patient has been taking Tylenol and ibuprofen at home without relief. He is not taking any stronger pain medications.      Past Medical History:  Diagnosis Date  . Cluster headache   . Complication of anesthesia   . COPD (chronic obstructive pulmonary disease) (HCC)    mild case -was told this  . Epilepsy (HCC)   . MS (multiple sclerosis) (HCC)   . Nerve pain    in legs  . PONV (postoperative nausea and vomiting)   . Seizures (HCC)    no medications for this for 20 years  . Trigeminal neuralgia     Patient Active Problem List   Diagnosis Date Noted  . Back pain 08/18/2013  . Neck pain 08/18/2013  . Left shoulder pain 01/15/2013    Past Surgical History:  Procedure Laterality Date  . CHOLECYSTECTOMY N/A 06/07/2017   Procedure: LAPAROSCOPIC CHOLECYSTECTOMY WITH INTRAOPERATIVE CHOLANGIOGRAM;  Surgeon: Avel Peace, MD;  Location: [facility];  Service: General;  Laterality: N/A;  . UPPER GASTROINTESTINAL ENDOSCOPY    . WISDOM TOOTH EXTRACTION         Home Medications    Prior to Admission medications   Medication Sig Start Date End Date  Taking? Authorizing Provider  acetaminophen (TYLENOL) 500 MG tablet Take 1,000 mg by mouth 2 (two) times daily as needed for moderate pain or headache.    [provider]  baclofen (LIORESAL) 10 MG tablet Take 10 mg by mouth 2 (two) times daily as needed for muscle spasms.    [provider]  carbamazepine (TEGRETOL) 100 MG chewable tablet Chew 100 mg by mouth 2 (two) times daily as needed (Trigeminal neuralgia).    [provider]  gabapentin (NEURONTIN) 300 MG capsule Take 900 mg by mouth 3 (three) times daily.     [provider]  ondansetron (ZOFRAN) 4 MG tablet Take 1 tablet (4 mg total) by mouth every 4 (four) hours as needed for nausea or vomiting. 06/07/17   Avel Peace, MD  oxyCODONE (OXY IR/ROXICODONE) 5 MG immediate release tablet Take 1-2 tablets (5-10 mg total) by mouth every 4 (four) hours as needed for moderate pain, severe pain or breakthrough pain. 06/07/17   Avel Peace, MD  Vitamin D, Ergocalciferol, (DRISDOL) 50000 units CAPS capsule Take 50,000 Units by mouth every Monday. 04/29/17   [provider]    Family History Family History  Problem Relation Age of Onset  . Diabetes Mother   . Hyperlipidemia Mother   . Heart attack Neg Hx   . Hypertension Neg Hx   . Sudden death Neg Hx  Social History Social History  Substance Use Topics  . Smoking status: Current Some Day Smoker    Types: Cigarettes  . Smokeless tobacco: Never Used  . Alcohol use Yes     Comment: rarely     Allergies   Adhesive [tape] and Penicillins   Review of Systems Review of Systems  Constitutional: Negative for fever.  HENT: Negative for rhinorrhea and sore throat.   Eyes: Negative for redness.  Respiratory: Negative for cough.   Cardiovascular: Negative for chest pain.  Gastrointestinal: Positive for abdominal pain and nausea. Negative for blood in stool, constipation, diarrhea and vomiting.  Genitourinary: Negative for dysuria.    Musculoskeletal: Negative for myalgias.  Skin: Negative for rash.  Neurological: Negative for headaches.     Physical Exam Updated Vital Signs BP 113/83 (BP Location: Right Arm)   Pulse 70   Temp 98.5 F (36.9 C) (Oral)   Resp 18   Ht 5\' 10"  (1.778 m)   Wt 60.8 kg (134 lb)   SpO2 99%   BMI 19.23 kg/m   Physical Exam  Constitutional: He appears well-developed and well-nourished.  HENT:  Head: Normocephalic and atraumatic.  Eyes: Conjunctivae are normal. Right eye exhibits no discharge. Left eye exhibits no discharge.  Neck: Normal range of motion. Neck supple.  Cardiovascular: Normal rate, regular rhythm and normal heart sounds.   Pulmonary/Chest: Effort normal and breath sounds normal.  Abdominal: Soft. There is tenderness. There is no guarding.  Patient with generalized tenderness, although worse in the right upper quadrant and epigastrium. Tenderness is moderate. No rebound or guarding.   Neurological: He is alert.  Skin: Skin is warm and dry.  Psychiatric: He has a normal mood and affect.  Nursing note and vitals reviewed.    ED Treatments / Results  Labs (all labs ordered are listed, but only abnormal results are displayed) Labs Reviewed  CBC WITH DIFFERENTIAL/PLATELET  COMPREHENSIVE METABOLIC PANEL    EKG  EKG Interpretation None       Radiology No results found.  Procedures Procedures (including critical care time)  Medications Ordered in ED Medications - No data to display   Initial Impression / Assessment and Plan / ED Course  I have reviewed the triage vital signs and the nursing notes.  Pertinent labs & imaging results that were available during my care of the patient were reviewed by me and considered in my medical decision making (see chart for details).     Patient seen and examined. Work-up initiated. Patient declines pain medication at this time. Given progressively worsening postoperative pain, will check labs and perform CT  imaging of the abdomen and pelvis to evaluate for postoperative complication.  Vital signs reviewed and are as follows: BP 113/83 (BP Location: Right Arm)   Pulse 70   Temp 98.5 F (36.9 C) (Oral)   Resp 18   Ht 5\' 10"  (1.778 m)   Wt 60.8 kg (134 lb)   SpO2 99%   BMI 19.23 kg/m   1:08 AM Labs look good. Pending CT. Handoff to Dr. Wilkie Aye who will follow-up on results and dispo per appropriate.   If negative, discharge to home. If any abnormal findings, follow-up with on-call surgeon.   Final Clinical Impressions(s) / ED Diagnoses   Final diagnoses:  Postoperative abdominal pain    New Prescriptions New Prescriptions   No medications on file     Renne Crigler, Cordelia Poche 06/26/17 0109    Nira Conn, MD 06/28/17 [phone]

## 2017-06-25 NOTE — ED Triage Notes (Signed)
Patient is complaining of abdominal pain since gallbladder removal on 06-07-17.States he saw his MD today and they prescribed pain medication, was told he could possibly have  an infection and they made him a follow up appointment for 06-26-17.    Pain 7/10 but 9/10 at times.  He states the pain medication makes his dizzy but it does help his pain.  Patient states he hasn't taken any medication in about 2 weeks ago.    He states he has swelling in his surgical area.    He has fever (99.7) and chills.  He has some n/v.  Denies blackouts or syncope.

## 2017-06-26 ENCOUNTER — Ambulatory Visit (HOSPITAL_BASED_OUTPATIENT_CLINIC_OR_DEPARTMENT_OTHER): Payer: Medicare Other

## 2017-06-26 LAB — COMPREHENSIVE METABOLIC PANEL
ALT: 21 U/L (ref 17–63)
ANION GAP: 7 (ref 5–15)
AST: 17 U/L (ref 15–41)
Albumin: 4 g/dL (ref 3.5–5.0)
Alkaline Phosphatase: 78 U/L (ref 38–126)
BUN: 12 mg/dL (ref 6–20)
CO2: 27 mmol/L (ref 22–32)
Calcium: 9.1 mg/dL (ref 8.9–10.3)
Chloride: 103 mmol/L (ref 101–111)
Creatinine, Ser: 0.77 mg/dL (ref 0.61–1.24)
Glucose, Bld: 85 mg/dL (ref 65–99)
POTASSIUM: 3.8 mmol/L (ref 3.5–5.1)
Sodium: 137 mmol/L (ref 135–145)
Total Bilirubin: 0.9 mg/dL (ref 0.3–1.2)
Total Protein: 6.7 g/dL (ref 6.5–8.1)

## 2017-06-26 MED ORDER — IOPAMIDOL (ISOVUE-300) INJECTION 61%
100.0000 mL | Freq: Once | INTRAVENOUS | Status: AC | PRN
  Administered 2017-06-26: 100 mL via INTRAVENOUS

## 2017-06-26 NOTE — ED Provider Notes (Signed)
Patient signed out pending CT scan. CT scan reviewed. No acute abnormality noted or consultations noted from recent cholecystectomy. On recheck, patient was reassured. Abdomen is soft and benign. Incisions are well-healing. Recommend close follow-up with general surgeon.   Shon Baton, MD 06/26/17 [phone]

## 2017-06-26 NOTE — ED Notes (Signed)
ED Provider at bedside. 

## 2017-06-26 NOTE — ED Notes (Signed)
Patient transported to CT 

## 2017-06-26 NOTE — Discharge Instructions (Signed)
You were seen today for abdominal pain. Your CT scan is negative. Follow-up closely with your surgeon.

## 2017-07-05 IMAGING — CT CT RENAL STONE PROTOCOL
2 of 4 series · 16 of 46 positions shown, 18 images · non-contrast
Comparison: 05/22/2014

CLINICAL DATA: Abdominal/flank pain x1 week, history of MS

EXAM:
CT ABDOMEN AND PELVIS WITHOUT CONTRAST
TECHNIQUE: Multidetector CT imaging of the abdomen and pelvis was performed
following the standard protocol without IV contrast.

[Series 2: renal stone < 200 lbs 5.0 b31f · axial · 0.66mm/px · z∈[-489,-79]mm · 13 of 90 slices shown, 15 images]
[im 4/90  soft-tissue]
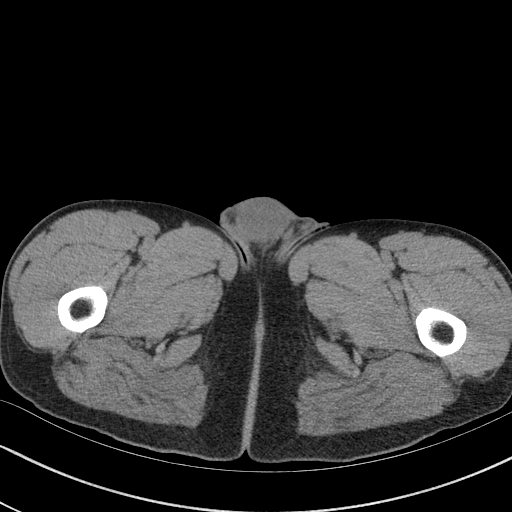
[im 4/90  bone]
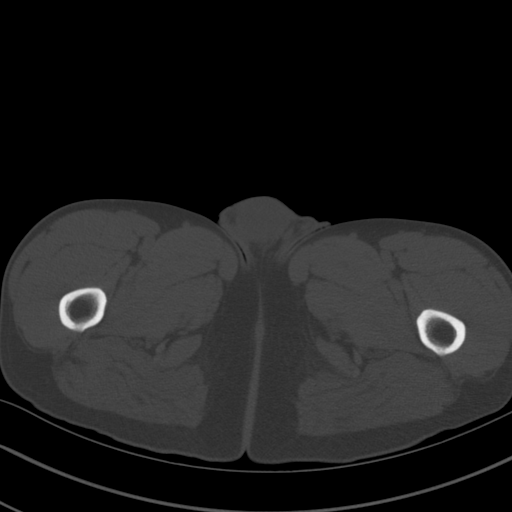
[im 11/90  soft-tissue]
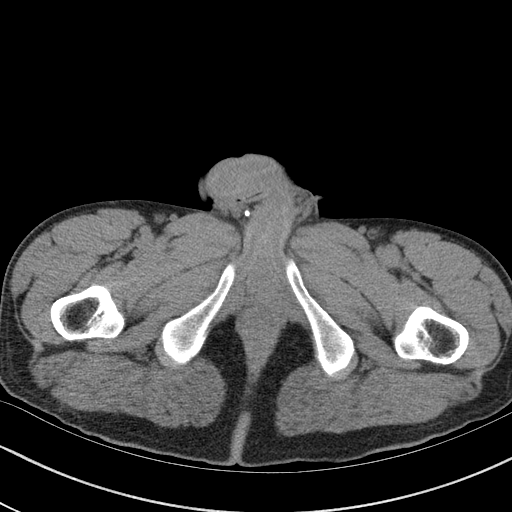
[im 18/90  soft-tissue]
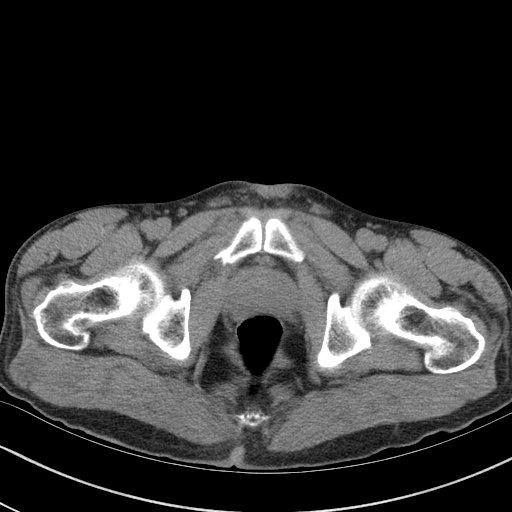
[im 25/90  soft-tissue]
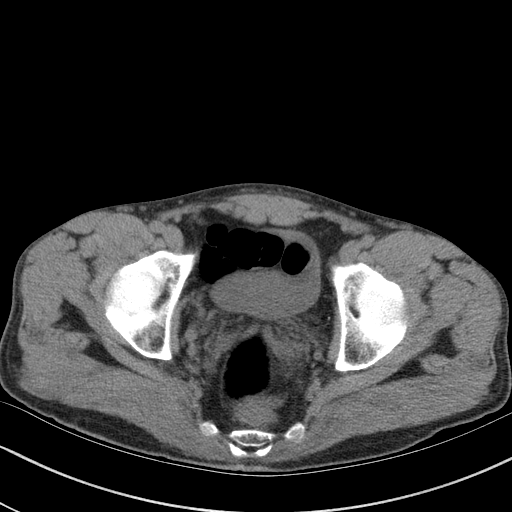
[im 33/90  soft-tissue]
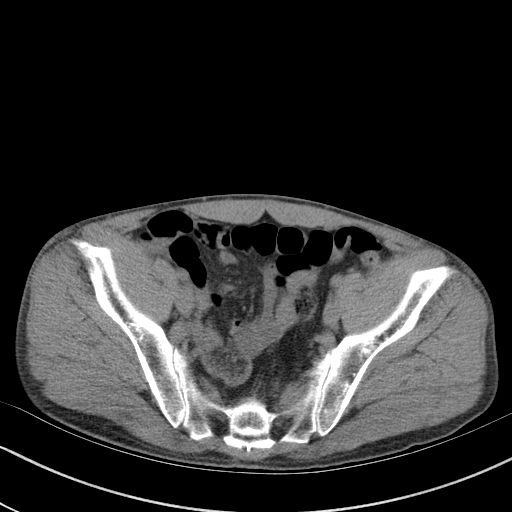
[im 40/90  soft-tissue]
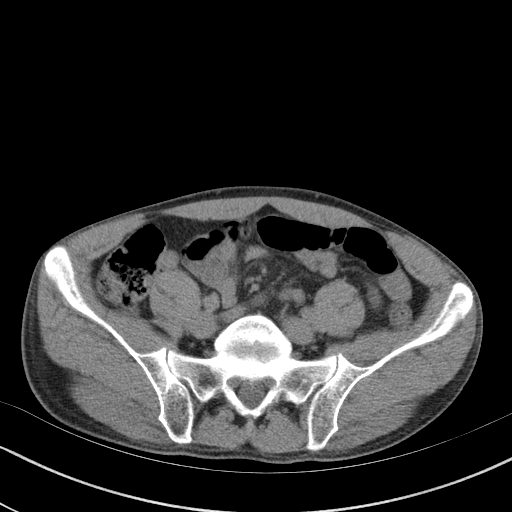
[im 47/90  soft-tissue]
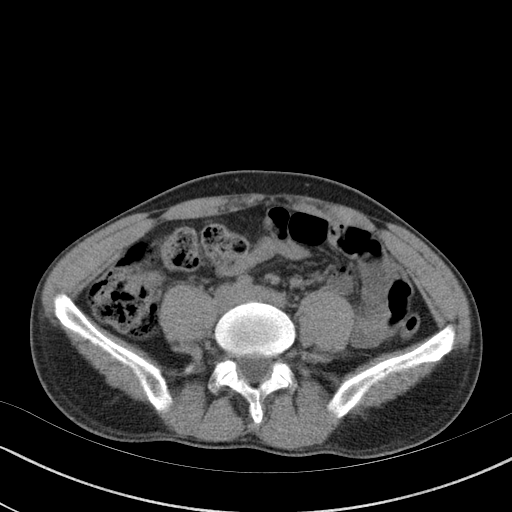
[im 50/90  soft-tissue]
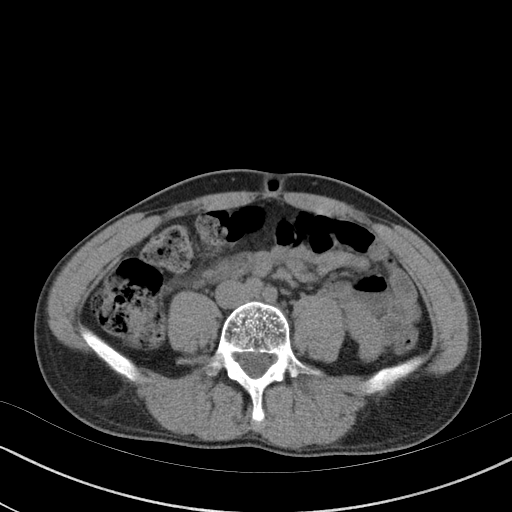
[im 57/90  soft-tissue]
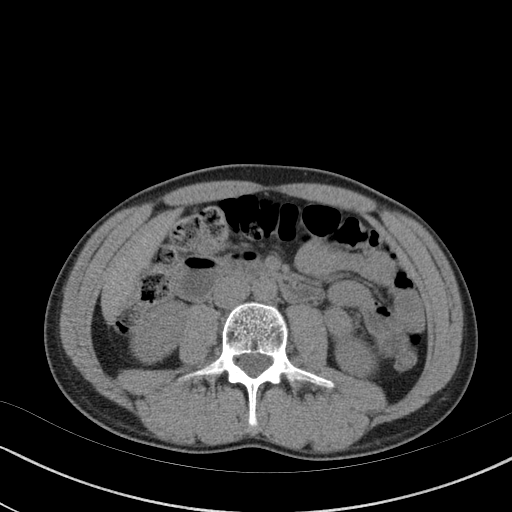
[im 57/90  bone]
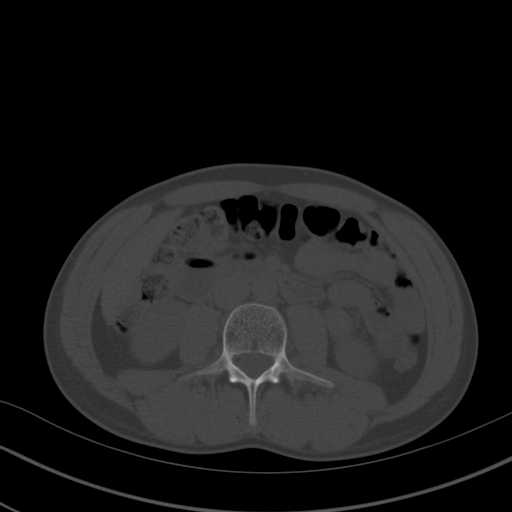
[im 65/90  soft-tissue]
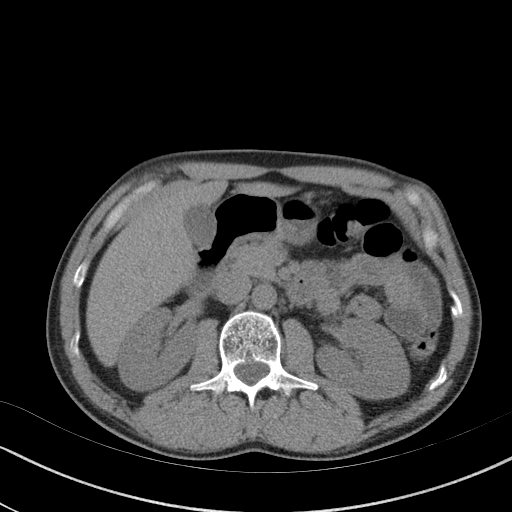
[im 72/90  soft-tissue]
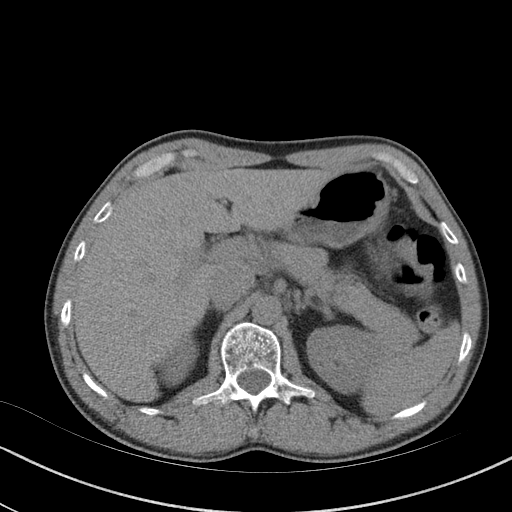
[im 79/90  soft-tissue]
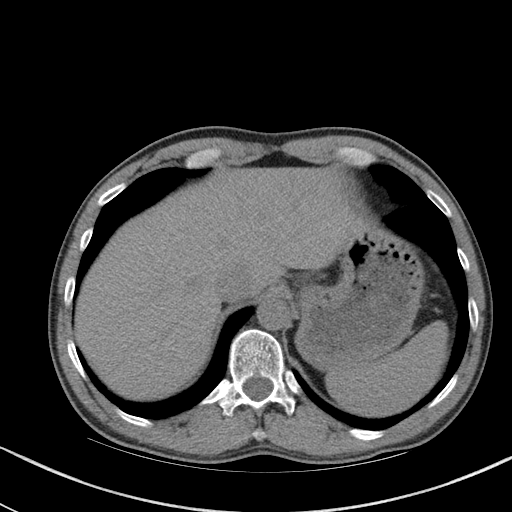
[im 86/90  soft-tissue]
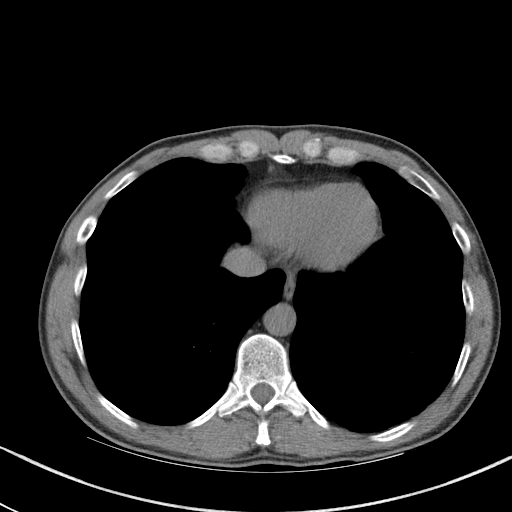

[Series 5: renal stone 3.0 coronal · coronal · 0.71mm/px · 3 of 71 slices shown]
[im 24/71  soft-tissue]
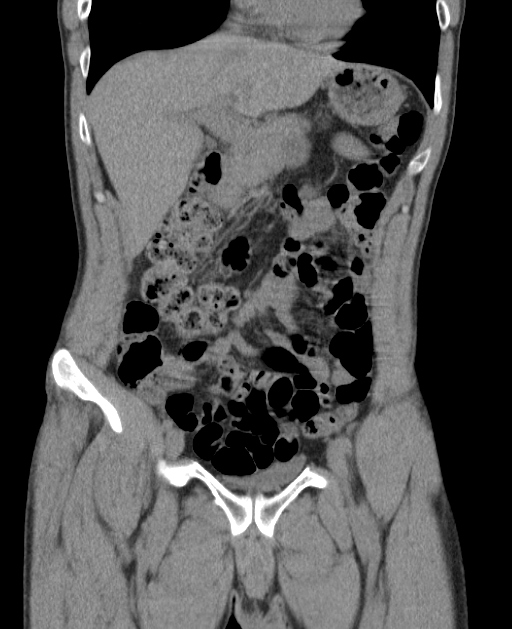
[im 32/71  soft-tissue]
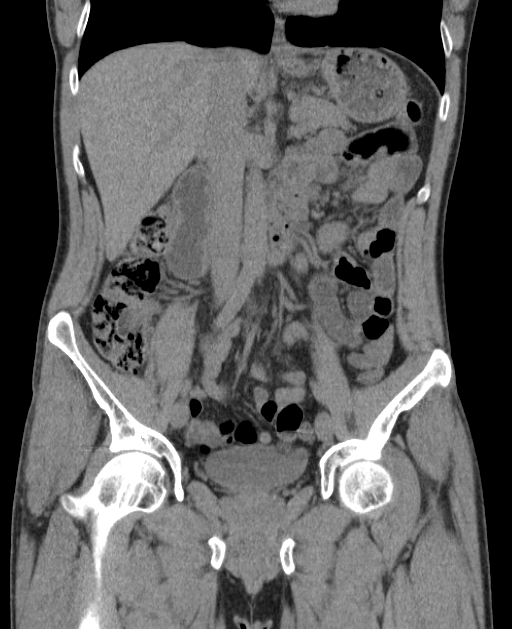
[im 39/71  soft-tissue]
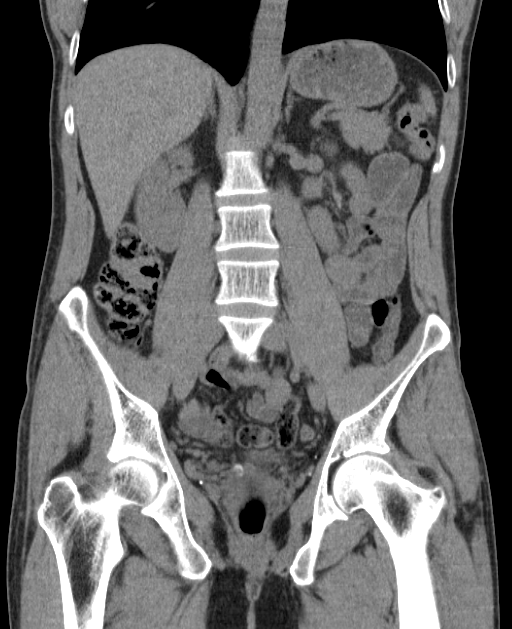

[16 of 46 positions shown; findings below may reference images not displayed]

FINDINGS: Lower chest:  Lung bases are clear.

Hepatobiliary: Unenhanced liver is unremarkable.

Gallbladder is unremarkable. No intrahepatic or extrahepatic ductal
dilatation.

Pancreas: Within normal limits.

Spleen: Within normal limits.

Adrenals/Urinary Tract: Adrenal glands are within normal limits.

2 mm calculus in the left upper kidney (series 2/image 20). Right
kidney is unremarkable.

No ureteral or bladder calculi.  No hydronephrosis.

Bladder is within normal limits.

Stomach/Bowel: Stomach is within normal limits.

No evidence of bowel obstruction.

Normal appendix (series 2/image 54).

Vascular/Lymphatic: No evidence of abdominal aortic aneurysm.

No suspicious abdominopelvic lymphadenopathy.

Reproductive: Prostate is unremarkable.

Other: No abdominopelvic ascites.

Musculoskeletal: Visualized osseous structures are within normal
limits.
IMPRESSION: 2 mm nonobstructing left upper pole renal calculus.

No ureteral or bladder calculi.  No hydronephrosis.

## 2017-11-02 ENCOUNTER — Emergency Department (HOSPITAL_BASED_OUTPATIENT_CLINIC_OR_DEPARTMENT_OTHER)
Admission: EM | Admit: 2017-11-02 | Discharge: 2017-11-02 | Disposition: A | Discharge status: Home / Self Care | Payer: Medicare Other | Attending: Emergency Medicine | Admitting: Emergency Medicine

## 2017-11-02 ENCOUNTER — Encounter (HOSPITAL_BASED_OUTPATIENT_CLINIC_OR_DEPARTMENT_OTHER): Payer: Self-pay | Admitting: *Deleted

## 2017-11-02 ENCOUNTER — Other Ambulatory Visit: Payer: Self-pay

## 2017-11-02 DIAGNOSIS — Y939 Activity, unspecified: Secondary | ICD-10-CM | POA: Insufficient documentation

## 2017-11-02 DIAGNOSIS — S161XXA Strain of muscle, fascia and tendon at neck level, initial encounter: Secondary | ICD-10-CM | POA: Diagnosis not present

## 2017-11-02 DIAGNOSIS — X58XXXA Exposure to other specified factors, initial encounter: Secondary | ICD-10-CM | POA: Diagnosis not present

## 2017-11-02 DIAGNOSIS — Z79899 Other long term (current) drug therapy: Secondary | ICD-10-CM | POA: Diagnosis not present

## 2017-11-02 DIAGNOSIS — Y929 Unspecified place or not applicable: Secondary | ICD-10-CM | POA: Diagnosis not present

## 2017-11-02 DIAGNOSIS — F17210 Nicotine dependence, cigarettes, uncomplicated: Secondary | ICD-10-CM | POA: Insufficient documentation

## 2017-11-02 DIAGNOSIS — F17290 Nicotine dependence, other tobacco product, uncomplicated: Secondary | ICD-10-CM | POA: Diagnosis not present

## 2017-11-02 DIAGNOSIS — J449 Chronic obstructive pulmonary disease, unspecified: Secondary | ICD-10-CM | POA: Diagnosis not present

## 2017-11-02 DIAGNOSIS — Y998 Other external cause status: Secondary | ICD-10-CM | POA: Insufficient documentation

## 2017-11-02 DIAGNOSIS — S199XXA Unspecified injury of neck, initial encounter: Secondary | ICD-10-CM | POA: Diagnosis present

## 2017-11-02 MED ORDER — IBUPROFEN 800 MG PO TABS
800.0000 mg | ORAL_TABLET | Freq: Once | ORAL | Status: AC
  Administered 2017-11-02: 800 mg via ORAL
  Filled 2017-11-02: qty 1

## 2017-11-02 MED ORDER — CYCLOBENZAPRINE HCL 10 MG PO TABS: 10.0000 mg | ORAL_TABLET | Freq: Three times a day (TID) | ORAL | 0 refills | Status: AC | PRN

## 2017-11-02 NOTE — ED Notes (Signed)
EDP at [facility] 

## 2017-11-02 NOTE — ED Triage Notes (Signed)
C/o L neck pain, and redness, describes as throbbing, onset last night, woke me again at 0430 this am. Also mentions some nausea, some dizziness ("which are not new, and go together"), (denies: VD, fever, syncope, recent illness, cough, congestion, cold sx, hearing or visual changes). States, "concerned for blood clot in neck". No h/o similar. Mentions h/o thyroid issues and MS.   Alert, NAD, calm, interactive, resps e/u, speaking in clear complete sentences, no dyspnea noted, skin W&D, VSS.

## 2017-11-02 NOTE — Discharge Instructions (Signed)
You may alternate Tylenol 1000 mg every 6 hours as needed for pain and Ibuprofen 800 mg every 8 hours as needed for pain.  Please take Ibuprofen with food.   There is no sign of blood clot in your veins, arteries.  No sign of infection.  No sign of enlarged thyroid, goiter on exam.

## 2017-11-02 NOTE — ED Provider Notes (Signed)
TIME SEEN: 6:31 AM  CHIEF COMPLAINT: Left-sided neck pain  HPI: Patient is a 42 year old male with history of trigeminal neuralgia, MS, seizures not on medications who presents emergency department with left-sided neck pain.  Pain present for the past couple of days.  Worse with turning his head to the left side.  No known injury.  No posterior neck pain.  Denies any numbness, tingling or focal weakness.  No vision changes.  States that he woke up with the pain and looked in the mirror and thought the side of his neck appeared red.  It is not hot.  He has had no fever.  His significant other was concerned that he could have a blood clot.  He has never had a PE or DVT before.  There is no swelling of the neck or down either arm.  No history of carotid stenosis.  No previous stroke.  Also concerned that this could be shingles.  No current rash.  Also states he is concerned this could be from a goiter.  States he has had one before.  No enlarged thyroid on exam.  No difficulty swallowing.  No sore throat or dental pain.  No facial swelling.  Normal speech.  No ear pain or drainage.  ROS: See HPI Constitutional: no fever  Eyes: no drainage  ENT: no runny nose   Cardiovascular:  no chest pain  Resp: no SOB  GI: no vomiting GU: no dysuria Integumentary: no rash  Allergy: no hives  Musculoskeletal: no leg swelling  Neurological: no slurred speech ROS otherwise negative  PAST MEDICAL HISTORY/PAST SURGICAL HISTORY:  Past Medical History:  Diagnosis Date  . Cluster headache   . Complication of anesthesia   . COPD (chronic obstructive pulmonary disease) (HCC)    mild case -was told this  . Epilepsy (HCC)   . MS (multiple sclerosis) (HCC)   . Nerve pain    in legs  . PONV (postoperative nausea and vomiting)   . Seizures (HCC)    no medications for this for 20 years  . Trigeminal neuralgia     MEDICATIONS:  Prior to Admission medications   Medication Sig Start Date End Date Taking?  Authorizing Provider  acetaminophen (TYLENOL) 500 MG tablet Take 1,000 mg by mouth 2 (two) times daily as needed for moderate pain or headache.    [provider]  baclofen (LIORESAL) 10 MG tablet Take 10 mg by mouth 2 (two) times daily as needed for muscle spasms.    [provider]  carbamazepine (TEGRETOL) 100 MG chewable tablet Chew 100 mg by mouth 2 (two) times daily as needed (Trigeminal neuralgia).    [provider]  gabapentin (NEURONTIN) 300 MG capsule Take 900 mg by mouth 3 (three) times daily.     [provider]  ondansetron (ZOFRAN) 4 MG tablet Take 1 tablet (4 mg total) by mouth every 4 (four) hours as needed for nausea or vomiting. 06/07/17   Avel Peace, MD  oxyCODONE (OXY IR/ROXICODONE) 5 MG immediate release tablet Take 1-2 tablets (5-10 mg total) by mouth every 4 (four) hours as needed for moderate pain, severe pain or breakthrough pain. 06/07/17   Avel Peace, MD  Vitamin D, Ergocalciferol, (DRISDOL) 50000 units CAPS capsule Take 50,000 Units by mouth every Monday. 04/29/17   [provider]    ALLERGIES:  Allergies  Allergen Reactions  . Adhesive [Tape] Other (See Comments)    Burns - use paper tape  . Penicillins Nausea And Vomiting  Has patient had a PCN reaction causing immediate rash, facial/tongue/throat swelling, SOB or lightheadedness with hypotension: No Has patient had a PCN reaction causing severe rash involving mucus membranes or skin necrosis: No Has patient had a PCN reaction that required hospitalization: No Has patient had a PCN reaction occurring within the last 10 years: No If all of the above answers are "NO", then may proceed with Cephalosporin use.     SOCIAL HISTORY:  Social History   Tobacco Use  . Smoking status: Current Some Day Smoker    Types: Cigarettes, E-cigarettes  . Smokeless tobacco: Never Used  . Tobacco comment: "vape"  Substance Use Topics  . Alcohol use: Yes    Comment:  rarely    FAMILY HISTORY: Family History  Problem Relation Age of Onset  . Diabetes Mother   . Hyperlipidemia Mother   . Heart attack Neg Hx   . Hypertension Neg Hx   . Sudden death Neg Hx     EXAM: BP 114/69 (BP Location: Left Arm)   Pulse 78   Temp 98.2 F (36.8 C) (Oral)   Resp 14   Ht 5\' 10"  (1.778 m)   Wt 65.8 kg (145 lb)   SpO2 99%   BMI 20.81 kg/m  CONSTITUTIONAL: Alert and oriented and responds appropriately to questions. Well-appearing; well-nourished HEAD: Normocephalic, atraumatic, no lesions noted to his scalp EYES: Conjunctivae clear, pupils appear equal, EOMI ENT: normal nose; moist mucous membranes; No pharyngeal erythema or petechiae, no tonsillar hypertrophy or exudate, no uvular deviation, no unilateral swelling, no trismus or drooling, no muffled voice, normal phonation, no stridor, no dental caries present, no drainable dental abscess noted, no Ludwig's angina, tongue sits flat in the bottom of the mouth, no angioedema, no facial erythema or warmth, no facial swelling; no pain with movement of the neck.  TMs are clear bilaterally without erythema, purulence, bulging, perforation, effusion.  No cerumen impaction or sign of foreign body in the external auditory canal. No inflammation, erythema or drainage from the external auditory canal. No signs of mastoiditis. No pain with manipulation of the pinna bilaterally. NECK: Supple, no meningismus, no nuchal rigidity, no LAD, mildly tender over the sternocleidomastoid on the left side but there is no swelling, significant redness or warmth.  No lymphadenopathy or masses.  No fluctuance or induration.  No thyromegaly.  Patient has no carotid bruit. CARD: RRR; S1 and S2 appreciated; no murmurs, no clicks, no rubs, no gallops RESP: Normal chest excursion without splinting or tachypnea; breath sounds clear and equal bilaterally; no wheezes, no rhonchi, no rales, no hypoxia or respiratory distress, speaking full  sentences ABD/GI: Normal bowel sounds; non-distended; soft, non-tender, no rebound, no guarding, no peritoneal signs, no hepatosplenomegaly BACK:  The back appears normal and is non-tender to palpation, there is no CVA tenderness EXT: Normal ROM in all joints; non-tender to palpation; no edema; normal capillary refill; no cyanosis, no calf tenderness or swelling, no swelling noted in either arm or 2+ radial pulses bilaterally.    SKIN: Normal color for age and race; warm; no rash, no vesicular lesions NEURO: Moves all extremities equally sensation diffusely, cranial nerves II through XII intact, normal speech, normal gait  PSYCH: The patient's mood and manner are appropriate. Grooming and personal hygiene are appropriate.  MEDICAL DECISION MAKING: Patient here with complaints of left-sided neck pain.  Suspect cervical strain.  No sign of cellulitis, abscess, otitis media or otitis externa, mastoiditis, deep space neck infection, PTA, pharyngitis on exam.  No  sign of arterial obstruction or DVT.  He has no carotid bruit.  There is no thyromegaly.  I do not see any skin lesions.  No sign of shingles.,  No sign of Ramsay Hunt.  We have discussed all of these things at length.  He states he feels much better as he is now reassured.  Recommended alternating Tylenol and Motrin for pain and will discharge with prescription of Flexeril.  Discussed return precautions.  He has no other infectious symptoms.  No focal neurologic deficits.  I feel he is safe for discharge home.  He does have a PCP to follow-up with as needed.  At this time, I do not feel there is any life-threatening condition present. I have reviewed and discussed all results (EKG, imaging, lab, urine as appropriate) and exam findings with patient/family. I have reviewed nursing notes and appropriate previous records.  I feel the patient is safe to be discharged home without further emergent workup and can continue workup as an outpatient as needed.  Discussed usual and customary return precautions. Patient/family verbalize understanding and are comfortable with this plan.  Outpatient follow-up has been provided if needed. All questions have been answered.      Sayer Masini, Layla Maw, DO 11/02/17 [phone]

## 2021-08-01 ENCOUNTER — Other Ambulatory Visit: Payer: Self-pay | Admitting: Internal Medicine

## 2021-08-01 DIAGNOSIS — R1013 Epigastric pain: Secondary | ICD-10-CM

## 2021-08-01 DIAGNOSIS — R1031 Right lower quadrant pain: Secondary | ICD-10-CM

## 2021-08-08 ENCOUNTER — Ambulatory Visit
Admission: RE | Admit: 2021-08-08 | Discharge: 2021-08-08 | Disposition: A | Discharge status: Home / Self Care | Payer: Medicare HMO | Source: Ambulatory Visit | Attending: Internal Medicine | Admitting: Internal Medicine

## 2021-08-08 ENCOUNTER — Other Ambulatory Visit: Payer: Self-pay

## 2021-08-08 DIAGNOSIS — R1031 Right lower quadrant pain: Secondary | ICD-10-CM

## 2021-08-08 DIAGNOSIS — R1013 Epigastric pain: Secondary | ICD-10-CM

## 2021-10-31 ENCOUNTER — Emergency Department (HOSPITAL_BASED_OUTPATIENT_CLINIC_OR_DEPARTMENT_OTHER): Payer: Medicare HMO

## 2021-10-31 ENCOUNTER — Emergency Department (HOSPITAL_BASED_OUTPATIENT_CLINIC_OR_DEPARTMENT_OTHER)
Admission: EM | Admit: 2021-10-31 | Discharge: 2021-11-01 | Disposition: A | Discharge status: Home / Self Care | Payer: Medicare HMO | Attending: Emergency Medicine | Admitting: Emergency Medicine

## 2021-10-31 ENCOUNTER — Encounter (HOSPITAL_BASED_OUTPATIENT_CLINIC_OR_DEPARTMENT_OTHER): Payer: Self-pay | Admitting: *Deleted

## 2021-10-31 ENCOUNTER — Other Ambulatory Visit: Payer: Self-pay

## 2021-10-31 DIAGNOSIS — R0602 Shortness of breath: Secondary | ICD-10-CM | POA: Insufficient documentation

## 2021-10-31 DIAGNOSIS — F419 Anxiety disorder, unspecified: Secondary | ICD-10-CM | POA: Diagnosis not present

## 2021-10-31 DIAGNOSIS — F410 Panic disorder [episodic paroxysmal anxiety] without agoraphobia: Secondary | ICD-10-CM | POA: Diagnosis not present

## 2021-10-31 DIAGNOSIS — J3489 Other specified disorders of nose and nasal sinuses: Secondary | ICD-10-CM | POA: Diagnosis not present

## 2021-10-31 DIAGNOSIS — R0789 Other chest pain: Secondary | ICD-10-CM | POA: Insufficient documentation

## 2021-10-31 DIAGNOSIS — R519 Headache, unspecified: Secondary | ICD-10-CM | POA: Diagnosis not present

## 2021-10-31 DIAGNOSIS — R7309 Other abnormal glucose: Secondary | ICD-10-CM | POA: Insufficient documentation

## 2021-10-31 DIAGNOSIS — R079 Chest pain, unspecified: Secondary | ICD-10-CM

## 2021-10-31 LAB — CBC
HCT: 39.7 % (ref 39.0–52.0)
Hemoglobin: 14.3 g/dL (ref 13.0–17.0)
MCH: 31 pg (ref 26.0–34.0)
MCHC: 36 g/dL (ref 30.0–36.0)
MCV: 85.9 fL (ref 80.0–100.0)
Platelets: 242 10*3/uL (ref 150–400)
RBC: 4.62 MIL/uL (ref 4.22–5.81)
RDW: 11.6 % (ref 11.5–15.5)
WBC: 8.6 10*3/uL (ref 4.0–10.5)
nRBC: 0 % (ref 0.0–0.2)

## 2021-10-31 LAB — TROPONIN I (HIGH SENSITIVITY): Troponin I (High Sensitivity): <2 ng/L (ref <18)

## 2021-10-31 LAB — BASIC METABOLIC PANEL
Anion gap: 8 (ref 5–15)
BUN: 13 mg/dL (ref 6–20)
CO2: 22 mmol/L (ref 22–32)
Calcium: 8.7 mg/dL — ABNORMAL LOW (ref 8.9–10.3)
Chloride: 103 mmol/L (ref 98–111)
Creatinine, Ser: 1.07 mg/dL (ref 0.61–1.24)
GFR, Estimated: >60 mL/min (ref >60)
Glucose, Bld: 101 mg/dL — ABNORMAL HIGH (ref 70–99)
Potassium: 3.6 mmol/L (ref 3.5–5.1)
Sodium: 133 mmol/L — ABNORMAL LOW (ref 135–145)

## 2021-10-31 NOTE — ED Notes (Signed)
Pt states they would prefer to go by "Jon Reeves". Transgender male to male.

## 2021-10-31 NOTE — ED Triage Notes (Addendum)
C/o mid sternal chest pain x 4 days, SOb at times. Pt reports increased stress and anxiety

## 2021-10-31 NOTE — ED Notes (Signed)
Upon entering pt's room, talked about cc. Pt states they have a cyst on their brain. States that they have been having clear liquid draining from nose. Concerned it is CSF

## 2021-11-01 LAB — CBG MONITORING, ED: Glucose-Capillary: <10 mg/dL — CL (ref 70–99)

## 2021-11-01 NOTE — Discharge Instructions (Signed)
I have attached directions/instructions for CSF leak for you to peruse and keep track of symptoms.  You have narrowing of your cribiform plate which would be a potential source for possible CSF leak. You need to follow up with ENT (referred above) for further evaluation of same.   The fluid drainage had a glucose of less than 10 making CSF less likely but is not definitive.   If possible, your doctor needs to order a beta-2-transferrin test since one was not able to be obtained here. This in concert with further imaging would be definitive testing for CSF leak.

## 2021-11-01 NOTE — ED Notes (Signed)
CBG collected on nasal drainage, <10.

## 2021-11-01 NOTE — ED Provider Notes (Signed)
[facility] EMERGENCY DEPARTMENT Provider Note   CSN: [id number] Arrival date & time: 10/31/21  2225     History  Chief Complaint  Patient presents with   Chest Pain    Jon Reeves is a 46 y.o. adult.  46 year old biologic male that is transitioning to male on estrogen progesterone the presents to the emergency department today with a couple different complaints.  Sounds like over the last 7 to 10 days the patient has had some clear drainage from his nose and symptoms coughing up out of his throat.  Has not had any allergic, respiratory or other sinus infection type symptoms and is concerned that it might be a CSF leak as she does have a cyst on her brain.  Has had conversations with her neurologist who recommended come the emergency room for urgent evaluation and follow-up with her as needed.  Sounds like sometimes the fluid will leak with head position changes.  Sometimes the fluid will leak with increased intracranial pressure and the feeling of a right-sided headache however the patient has a history of cluster headaches.  Does not feel lightheaded.  No recent trauma or other injuries.  She also complains of chest discomfort during this time.  She states that she has had a prolonged anxious state for about a week with a couple intermixed panic attacks.  Has had multiple crying episodes secondary to the death of a friend.  She feels like she has some chest pressure shortness of breath when she has her anxiety attacks and off and on when she is just anxious.  She is not sure if she is having a heart attack but does not think that that kind of discomfort is minor.  No associated diaphoresis, lightheadedness, vomiting or other symptoms.   Chest Pain     Home Medications Prior to Admission medications   Medication Sig Start Date End Date Taking? Authorizing Provider  acetaminophen (TYLENOL) 500 MG tablet Take 1,000 mg by mouth 2 (two) times daily as needed for moderate  pain or headache.    [provider]  baclofen (LIORESAL) 10 MG tablet Take 10 mg by mouth 2 (two) times daily as needed for muscle spasms.    [provider]  carbamazepine (TEGRETOL) 100 MG chewable tablet Chew 100 mg by mouth 2 (two) times daily as needed (Trigeminal neuralgia).    [provider]  cyclobenzaprine (FLEXERIL) 10 MG tablet Take 1 tablet (10 mg total) by mouth 3 (three) times daily as needed for muscle spasms. 11/02/17   Ward, Layla Maw, DO  gabapentin (NEURONTIN) 300 MG capsule Take 900 mg by mouth 3 (three) times daily.     [provider]  ondansetron (ZOFRAN) 4 MG tablet Take 1 tablet (4 mg total) by mouth every 4 (four) hours as needed for nausea or vomiting. 06/07/17   Avel Peace, MD  oxyCODONE (OXY IR/ROXICODONE) 5 MG immediate release tablet Take 1-2 tablets (5-10 mg total) by mouth every 4 (four) hours as needed for moderate pain, severe pain or breakthrough pain. 06/07/17   Avel Peace, MD  Vitamin D, Ergocalciferol, (DRISDOL) 50000 units CAPS capsule Take 50,000 Units by mouth every Monday. 04/29/17   [provider]      Allergies    Adhesive [tape] and Penicillins    Review of Systems   Review of Systems  Cardiovascular:  Positive for chest pain.   Physical Exam Updated Vital Signs BP 119/80    Pulse 72    Temp  98.5 F (36.9 C) (Oral)    Resp (!) 21    Ht 5\' 9"  (1.753 m)    Wt 72.6 kg    SpO2 99%    BMI 23.63 kg/m  Physical Exam Vitals and nursing note reviewed.  Constitutional:      Appearance: She is well-developed.  HENT:     Head: Normocephalic and atraumatic.  Cardiovascular:     Rate and Rhythm: Normal rate.  Pulmonary:     Effort: Pulmonary effort is normal. No respiratory distress.     Breath sounds: No decreased breath sounds, wheezing or rales.  Chest:     Chest wall: No mass, deformity or tenderness.  Abdominal:     General: There is no distension.     Palpations: Abdomen is soft.   Musculoskeletal:        General: Normal range of motion.     Cervical back: Normal range of motion.  Skin:    General: Skin is warm and dry.  Neurological:     Mental Status: She is alert.    ED Results / Procedures / Treatments   Labs (all labs ordered are listed, but only abnormal results are displayed) Labs Reviewed  BASIC METABOLIC PANEL - Abnormal; Notable for the following components:      Result Value   Sodium 133 (*)    Glucose, Bld 101 (*)    Calcium 8.7 (*)    All other components within normal limits  CBG MONITORING, ED - Abnormal; Notable for the following components:   Glucose-Capillary <10 (*)    All other components within normal limits  CBC  MISC [facility] TEST (SEND OUT)  TROPONIN I (HIGH SENSITIVITY)    EKG EKG Interpretation  Date/Time:  Tuesday October 31 2021 22:33:45 EST Ventricular Rate:  69 PR Interval:  148 QRS Duration: 76 QT Interval:  400 QTC Calculation: 429 R Axis:   67 Text Interpretation: duplicate Confirmed by [id number] [phone]) on 10/31/2021 10:37:02 PM  Radiology DG Chest 2 View  Result Date: 10/31/2021 CLINICAL DATA:  Chest pain. EXAM: CHEST - 2 VIEW COMPARISON:  Chest radiograph dated 04/19/2016. FINDINGS: No focal consolidation, pleural effusion, or pneumothorax. The cardiac silhouette is within normal limits. No acute osseous pathology. IMPRESSION: No active cardiopulmonary disease. Electronically Signed   By: 06/20/2016 M.D.   On: 10/31/2021 22:58    Procedures Procedures    Medications Ordered in ED Medications - No data to display  ED Course/ Medical Decision Making/ A&P                           Medical Decision Making Amount and/or Complexity of Data Reviewed Labs: ordered. Radiology: ordered.   CBG checked on the fluid expressed from the nose that the patient is worried about and glucose is less than 10.  This makes it very unlikely to be CSF however after multiple discussions with laboratory  personnel we will order a beta-2 transferrin which is a send out.  This can be followed up by her neurologist at their follow-up appointment.  Pending CT scans to evaluate for any obvious connection between CSF and nasal cavity.  As far as the chest pain goes a thing is most likely anxiety/panic attacks.  However there is element of pressure and shortness of breath with it so just check 1 troponin.  This is been going on for many days with a negative EKG so think 2 troponins is overkill.  Also consider pulmonary embolus however only risk factor being estrogen with no hypoxia, tachypnea, tachycardia, hypotension, leg swelling or other evidence of DVT/PE I think it is unlikely.  We will defer testing for that this time.  If workup is fine I think she is stable for discharge to follow-up with neurology and PCP as needed.  Ct with questionable cribriform narrowing is a possible CSF leak area.  There glucose noted above is the fluid that she thinks is CSF which is less than 10 making cerebrospinal fluid unlikely.  Not able to give a sample to check for beta-2 transferrin.   Will follow-up with neurology per previous plans but also will follow with ENT to get an evaluation of the cribriform plate.    Final Clinical Impression(s) / ED Diagnoses Final diagnoses:  Nonspecific chest pain  Nasal drainage    Rx / DC Orders ED Discharge Orders     None         Armas Mcbee, Barbara Cower, MD 11/01/21 [phone]

## 2021-11-28 ENCOUNTER — Encounter (HOSPITAL_COMMUNITY): Payer: Self-pay | Admitting: Radiology

## 2022-05-15 DIAGNOSIS — K439 Ventral hernia without obstruction or gangrene: Secondary | ICD-10-CM

## 2022-05-15 HISTORY — DX: Ventral hernia without obstruction or gangrene: K43.9

## 2022-07-21 ENCOUNTER — Encounter (HOSPITAL_BASED_OUTPATIENT_CLINIC_OR_DEPARTMENT_OTHER): Payer: Self-pay | Admitting: Emergency Medicine

## 2022-07-21 ENCOUNTER — Other Ambulatory Visit: Payer: Self-pay

## 2022-07-21 ENCOUNTER — Emergency Department (HOSPITAL_BASED_OUTPATIENT_CLINIC_OR_DEPARTMENT_OTHER)
Admission: EM | Admit: 2022-07-21 | Discharge: 2022-07-21 | Disposition: A | Discharge status: Home / Self Care | Payer: Medicare HMO | Attending: Emergency Medicine | Admitting: Emergency Medicine

## 2022-07-21 ENCOUNTER — Emergency Department (HOSPITAL_BASED_OUTPATIENT_CLINIC_OR_DEPARTMENT_OTHER): Payer: Medicare HMO

## 2022-07-21 DIAGNOSIS — K4090 Unilateral inguinal hernia, without obstruction or gangrene, not specified as recurrent: Secondary | ICD-10-CM | POA: Diagnosis not present

## 2022-07-21 DIAGNOSIS — R1031 Right lower quadrant pain: Secondary | ICD-10-CM

## 2022-07-21 LAB — CBC WITH DIFFERENTIAL/PLATELET
Abs Immature Granulocytes: 0.02 10*3/uL (ref 0.00–0.07)
Basophils Absolute: 0.1 10*3/uL (ref 0.0–0.1)
Basophils Relative: 1 %
Eosinophils Absolute: 0.1 10*3/uL (ref 0.0–0.5)
Eosinophils Relative: 2 %
HCT: 41.6 % (ref 39.0–52.0)
Hemoglobin: 14.5 g/dL (ref 13.0–17.0)
Immature Granulocytes: 0 %
Lymphocytes Relative: 19 %
Lymphs Abs: 1.6 10*3/uL (ref 0.7–4.0)
MCH: 31.9 pg (ref 26.0–34.0)
MCHC: 34.9 g/dL (ref 30.0–36.0)
MCV: 91.6 fL (ref 80.0–100.0)
Monocytes Absolute: 0.3 10*3/uL (ref 0.1–1.0)
Monocytes Relative: 4 %
Neutro Abs: 5.9 10*3/uL (ref 1.7–7.7)
Neutrophils Relative %: 74 %
Platelets: 260 10*3/uL (ref 150–400)
RBC: 4.54 MIL/uL (ref 4.22–5.81)
RDW: 11.4 % — ABNORMAL LOW (ref 11.5–15.5)
WBC: 8 10*3/uL (ref 4.0–10.5)
nRBC: 0 % (ref 0.0–0.2)

## 2022-07-21 LAB — BASIC METABOLIC PANEL
Anion gap: 7 (ref 5–15)
BUN: 9 mg/dL (ref 6–20)
CO2: 23 mmol/L (ref 22–32)
Calcium: 8.5 mg/dL — ABNORMAL LOW (ref 8.9–10.3)
Chloride: 103 mmol/L (ref 98–111)
Creatinine, Ser: 0.77 mg/dL (ref 0.61–1.24)
GFR, Estimated: >60 mL/min (ref >60)
Glucose, Bld: 129 mg/dL — ABNORMAL HIGH (ref 70–99)
Potassium: 3.6 mmol/L (ref 3.5–5.1)
Sodium: 133 mmol/L — ABNORMAL LOW (ref 135–145)

## 2022-07-21 LAB — URINALYSIS, ROUTINE W REFLEX MICROSCOPIC
Bilirubin Urine: NEGATIVE
Glucose, UA: 100 mg/dL — AB
Hgb urine dipstick: NEGATIVE
Ketones, ur: NEGATIVE mg/dL
Leukocytes,Ua: NEGATIVE
Nitrite: NEGATIVE
Protein, ur: NEGATIVE mg/dL
Specific Gravity, Urine: >=1.03 (ref 1.005–1.030)
pH: 5 (ref 5.0–8.0)

## 2022-07-21 MED ORDER — IOHEXOL 300 MG/ML  SOLN
100.0000 mL | Freq: Once | INTRAMUSCULAR | Status: AC | PRN
  Administered 2022-07-21: 100 mL via INTRAVENOUS

## 2022-07-21 MED ORDER — ACETAMINOPHEN 325 MG PO TABS
650.0000 mg | ORAL_TABLET | Freq: Once | ORAL | Status: AC
  Administered 2022-07-21: 650 mg via ORAL
  Filled 2022-07-21: qty 2

## 2022-07-21 NOTE — Discharge Instructions (Signed)
You were seen in the emergency department for your abdominal pain.  Your CT scan showed no evidence of appendicitis and no obvious hernia, however based on your symptoms you likely have a small hernia that is spontaneously reducing.  If you notice a bulge that is sticking out it is okay to push the bulge back in.  You can follow-up with general surgery as needed for hernia repair.  You should return to the emergency department if your hernia comes out and you are unable to push it back in, if becomes red, warm to touch and very painful to touch, you have fevers, you have repetitive vomiting, or if you have any other new or concerning symptoms.

## 2022-07-21 NOTE — ED Triage Notes (Signed)
Pt reports RT side pain and a "bulge" near groin area that started today; reports constipation for about a week

## 2022-07-21 NOTE — ED Provider Notes (Signed)
[facility] EMERGENCY DEPARTMENT Provider Note   CSN: [id number] Arrival date & time: 07/21/22  1230     History  Chief Complaint  Patient presents with   Abdominal Pain    Jon Reeves is a 46 y.o. adult.  Patient is a 46 year old male to male transgender with a past medical history of MS, migraines presenting to the emergency department with right lower quadrant abdominal pain.  Patient states that she started developing pain in the right lower quadrant earlier today while shopping.  She states that the pain gets worse upon standing and she feels a bulge when she stands.  She states that the pain is also worse with coughing.  She states that she feels nauseous but has had no vomiting.  She states that she has been constipated and has been having loose stool around her constipation.  She denies any dysuria or hematuria.  She denies any fevers or chills.  She states she had a cholecystectomy but denies any other abdominal surgeries.  The history is provided by the patient.  Abdominal Pain      Home Medications Prior to Admission medications   Medication Sig Start Date End Date Taking? Authorizing Provider  acetaminophen (TYLENOL) 500 MG tablet Take 1,000 mg by mouth 2 (two) times daily as needed for moderate pain or headache.    [provider]  baclofen (LIORESAL) 10 MG tablet Take 10 mg by mouth 2 (two) times daily as needed for muscle spasms.    [provider]  carbamazepine (TEGRETOL) 100 MG chewable tablet Chew 100 mg by mouth 2 (two) times daily as needed (Trigeminal neuralgia).    [provider]  cyclobenzaprine (FLEXERIL) 10 MG tablet Take 1 tablet (10 mg total) by mouth 3 (three) times daily as needed for muscle spasms. 11/02/17   Ward, Layla Maw, DO  gabapentin (NEURONTIN) 300 MG capsule Take 900 mg by mouth 3 (three) times daily.     [provider]  ondansetron (ZOFRAN) 4 MG tablet Take 1 tablet (4 mg total) by mouth  every 4 (four) hours as needed for nausea or vomiting. 06/07/17   Avel Peace, MD  oxyCODONE (OXY IR/ROXICODONE) 5 MG immediate release tablet Take 1-2 tablets (5-10 mg total) by mouth every 4 (four) hours as needed for moderate pain, severe pain or breakthrough pain. 06/07/17   Avel Peace, MD  Vitamin D, Ergocalciferol, (DRISDOL) 50000 units CAPS capsule Take 50,000 Units by mouth every Monday. 04/29/17   [provider]      Allergies    Adhesive [tape] and Penicillins    Review of Systems   Review of Systems  Gastrointestinal:  Positive for abdominal pain.    Physical Exam Updated Vital Signs BP (!) 115/90   Pulse 76   Temp 97.6 F (36.4 C) (Oral)   Resp 17   Ht 5\' 10"  (1.778 m)   Wt 67.1 kg   SpO2 100%   BMI 21.24 kg/m  Physical Exam Vitals and nursing note reviewed.  Constitutional:      General: She is not in acute distress.    Appearance: Normal appearance.  HENT:     Head: Normocephalic and atraumatic.     Nose: Nose normal.     Mouth/Throat:     Mouth: Mucous membranes are moist.     Pharynx: Oropharynx is clear.  Eyes:     Extraocular Movements: Extraocular movements intact.     Conjunctiva/sclera: Conjunctivae normal.  Cardiovascular:  Rate and Rhythm: Normal rate and regular rhythm.  Pulmonary:     Effort: Pulmonary effort is normal.     Breath sounds: Normal breath sounds.  Abdominal:     General: Abdomen is flat.     Palpations: Abdomen is soft.     Tenderness: There is abdominal tenderness (RLQ, R groin, no rebound or guarding). There is no right CVA tenderness or left CVA tenderness.     Comments: Palpable inguinal lymph node <1 cm, no palpable hernia or bulge  Musculoskeletal:        General: Normal range of motion.     Cervical back: Normal range of motion and neck supple.  Skin:    General: Skin is warm and dry.  Neurological:     General: No focal deficit present.     Mental Status: She is alert and oriented to person,  place, and time.  Psychiatric:        Mood and Affect: Mood normal.        Behavior: Behavior normal.     ED Results / Procedures / Treatments   Labs (all labs ordered are listed, but only abnormal results are displayed) Labs Reviewed  URINALYSIS, ROUTINE W REFLEX MICROSCOPIC - Abnormal; Notable for the following components:      Result Value   Glucose, UA 100 (*)    All other components within normal limits  CBC WITH DIFFERENTIAL/PLATELET - Abnormal; Notable for the following components:   RDW 11.4 (*)    All other components within normal limits  BASIC METABOLIC PANEL - Abnormal; Notable for the following components:   Sodium 133 (*)    Glucose, Bld 129 (*)    Calcium 8.5 (*)    All other components within normal limits    EKG None  Radiology CT Abdomen Pelvis W Contrast  Result Date: 07/21/2022 CLINICAL DATA:  46 year old male with acute RIGHT abdominal and pelvic pain. EXAM: CT ABDOMEN AND PELVIS WITH CONTRAST TECHNIQUE: Multidetector CT imaging of the abdomen and pelvis was performed using the standard protocol following bolus administration of intravenous contrast. RADIATION DOSE REDUCTION: This exam was performed according to the departmental dose-optimization program which includes automated exposure control, adjustment of the mA and/or kV according to patient size and/or use of iterative reconstruction technique. CONTRAST:  OMNIPAQUE IOHEXOL 300 MG/ML  SOLN COMPARISON:  08/08/2021 CT FINDINGS: Lower chest: No acute abnormality. Hepatobiliary: No significant hepatic abnormalities are identified. The patient is status post cholecystectomy. There is no evidence of intrahepatic or extrahepatic biliary dilatation. Pancreas: Unremarkable Spleen: Unremarkable Adrenals/Urinary Tract: The kidneys, adrenal glands and bladder are unremarkable. Stomach/Bowel: Stomach is within normal limits. Appendix appears normal. No evidence of bowel wall thickening, distention, or inflammatory  changes. Vascular/Lymphatic: No significant vascular findings are present. No enlarged abdominal or pelvic lymph nodes. Reproductive: Prostate is unremarkable. Other: No ascites, focal collection or pneumoperitoneum. No abdominal wall hernia identified. Musculoskeletal: No acute or suspicious bony abnormalities are noted. IMPRESSION: No evidence of acute or significant abnormality. No CT findings to suggest a cause for this patient's RIGHT abdominal pain. Electronically Signed   By: Harmon Pier M.D.   On: 07/21/2022 14:25    Procedures Procedures    Medications Ordered in ED Medications  acetaminophen (TYLENOL) tablet 650 mg (650 mg Oral Given 07/21/22 1315)  iohexol (OMNIPAQUE) 300 MG/ML solution 100 mL (100 mLs Intravenous Contrast Given 07/21/22 1404)    ED Course/ Medical Decision Making/ A&P Clinical Course as of 07/21/22 1504  Sat Jul 21, 2022  1500 Patient's CT scan without evidence of appendicitis or explanation of the patient's pain.  Based on symptoms likely has a reducible hernia and will be given outpatient surgery referral.  She was recommended Tylenol and Motrin as needed for pain and was given strict return precautions. [VK]    Clinical Course User Index [VK] Kemper Durie, DO                           Medical Decision Making This patient presents to the ED with chief complaint(s) of quadrant abdominal/groin pain with pertinent past medical history of MS, migraines, cholecystectomy which further complicates the presenting complaint. The complaint involves an extensive differential diagnosis and also carries with it a high risk of complications and morbidity.    The differential diagnosis includes hernia, no evidence of strangulated or incarcerated hernia and on exam, possible appendicitis, diverticulitis, UTI  Additional history obtained: Additional history obtained from N/A Records reviewed outpatient neurology records  ED Course and Reassessment: Patient is well  appearing in no acute distress on exam. Increased pain with standing but no palpable hernia making a strangulated or incarcerated hernia less likely.  Patient will be given Tylenol for pain and will have labs and CT to evaluate for possible hernia versus appendicitis as cause of the pain and she will be closely reassessed.  Independent labs interpretation:  The following labs were independently interpreted: Within normal range  Independent visualization of imaging: - I independently visualized the following imaging with scope of interpretation limited to determining acute life threatening conditions related to emergency care: CTAP, which revealed no acute disease  Consultation: - Consulted or discussed management/test interpretation w/ external professional: N/A  Consideration for admission or further workup: Patient stable for discharge home with primary care and general surgery as needed follow-up Social Determinants of health: N/A    Amount and/or Complexity of Data Reviewed Labs: ordered. Radiology: ordered.  Risk OTC drugs. Prescription drug management.          Final Clinical Impression(s) / ED Diagnoses Final diagnoses:  Inguinal pain, right  Right groin hernia    Rx / DC Orders ED Discharge Orders     None         Kemper Durie, DO 07/21/22 1504

## 2022-12-08 NOTE — Progress Notes (Signed)
Referring Provider: Wellness, Darel Hong* Primary Care Physician:  Wellness, Dunean   Reason for Consultation: Constipation   IMPRESSION:  Change in bowel habits. Now with constipation with associated abdominal pain, incomplete evacuation, and straining.  RLQ abdominal pain. Not explained by CT.  Dr. Windle Guard felt symptoms sound like inguinal hernia, but one was not found by exam or PE.   PLAN: Colonoscopy with 2 day bowel prep Consider adding a stool bulking agent such as psyllium or methylcellulose   HPI: Jon Reeves is a 47 y.o. adult referred by Dr. Windle Guard for further evaluation of constipation and right lower quadrant abdominal pain.  The history is obtained through the patient, records provided by Dr. Windle Guard, and review of the electronic health record. History significant for asthma, COPD, reflux, thyroid disease, migraines and suspected demyelinating disease followed by neurology.  She had a laparoscopic cholecystectomy in 2018.  Symptoms initially developed prior to cholecystectomy. But have returned and progressed.  Recently seen by Dr. Windle Guard for 6 months of right lower quadrant abdominal pain. She feels that there is an abdominal protrusion present at all times. Bothered by a hernia that causes pain with coughing and when she uses the bathroom.   She has alternating chronic constipation with diarrhea. There is significant straining, associated RLQ pain, and sense of incomplete evacuation.   No prior colonoscopy or GI evaluation.  Uses magnesium citrate 1000 mg every evening. If she doesn't do that, she will spend hours on the toilet.   CT of the to evaluate the symptoms in October 2023 showed no cause for the pain. No abdominal wall of inguinal hernia seen.  She had a CT in 08/08/21 that showed no cause, either.   Labs 10/23 showed a normal BMP and CBC except for glucose 101  There is no known family history of colon cancer or polyps. No family  history of stomach cancer or other GI malignancy. No family history of inflammatory bowel disease or celiac.    Past Medical History:  Diagnosis Date   Abdominal wall hernia 05/2022   Cluster headache    Complication of anesthesia    COPD (chronic obstructive pulmonary disease) (HCC)    mild case -was told this   Epilepsy ([place])    MS (multiple sclerosis) ([place])    Nerve pain    in legs   PONV (postoperative nausea and vomiting)    Seizures (HCC)    no medications for this for 20 years   Trigeminal neuralgia     Past Surgical History:  Procedure Laterality Date   CHOLECYSTECTOMY N/A 06/07/2017   Procedure: LAPAROSCOPIC CHOLECYSTECTOMY WITH INTRAOPERATIVE CHOLANGIOGRAM;  Surgeon: Jackolyn Confer, MD;  Location: [facility] ORS;  Service: General;  Laterality: N/A;   UPPER GASTROINTESTINAL ENDOSCOPY     WISDOM TOOTH EXTRACTION       Current Outpatient Medications  Medication Sig Dispense Refill   acetaminophen (TYLENOL) 500 MG tablet Take 1,000 mg by mouth 2 (two) times daily as needed for moderate pain or headache.     baclofen (LIORESAL) 10 MG tablet Take 10 mg by mouth 2 (two) times daily as needed for muscle spasms.     carbamazepine (TEGRETOL) 100 MG chewable tablet Chew 100 mg by mouth 2 (two) times daily as needed (Trigeminal neuralgia).     cyclobenzaprine (FLEXERIL) 10 MG tablet Take 1 tablet (10 mg total) by mouth 3 (three) times daily as needed for muscle spasms. 15 tablet 0   Esterified Estrogens 2.5 MG TABS  Take 1.5 mg by mouth.     gabapentin (NEURONTIN) 300 MG capsule Take 900 mg by mouth 3 (three) times daily.      ondansetron (ZOFRAN) 4 MG tablet Take 1 tablet (4 mg total) by mouth every 4 (four) hours as needed for nausea or vomiting. 20 tablet 0   oxyCODONE (OXY IR/ROXICODONE) 5 MG immediate release tablet Take 1-2 tablets (5-10 mg total) by mouth every 4 (four) hours as needed for moderate pain, severe pain or breakthrough pain. 40 tablet 0   Vitamin D, Ergocalciferol,  (DRISDOL) 50000 units CAPS capsule Take 50,000 Units by mouth every Monday.  1   No current facility-administered medications for this visit.    Allergies as of 12/11/2022 - Review Complete 12/11/2022  Allergen Reaction Noted   Adhesive [tape] Other (See Comments) 07/21/2013   Penicillins Nausea And Vomiting 09/20/2011    Family History  Problem Relation Age of Onset   Diabetes Mother    Hyperlipidemia Mother    Heart attack Neg Hx    Hypertension Neg Hx    Sudden death Neg Hx    Liver disease Neg Hx    Colon cancer Neg Hx    Esophageal cancer Neg Hx      Review of Systems: 12 system ROS is negative except as noted above with the addition of anxiety, depression, headaches, and urine leakage.   Physical Exam: General:   Alert,  well-nourished, pleasant and cooperative in NAD Head:  Normocephalic and atraumatic. Eyes:  Sclera clear, no icterus.   Conjunctiva pink. Ears:  Normal auditory acuity. Nose:  No deformity, discharge,  or lesions. Mouth:  No deformity or lesions.   Neck:  Supple; no masses or thyromegaly. Lungs:  Clear throughout to auscultation.   No wheezes. Heart:  Regular rate and rhythm; no murmurs. Abdomen:  Soft, nontender, nondistended, normal bowel sounds, no rebound or guarding. No hepatosplenomegaly.  No obvious hernia. I am unable to reproduce her abdominal pain. Rectal:  Deferred  Msk:  Symmetrical. No boney deformities LAD: No inguinal or umbilical LAD Extremities:  No clubbing or edema. Neurologic:  Alert and  oriented x4;  grossly nonfocal Skin:  Intact without significant lesions or rashes. Psych:  Alert and cooperative. Normal mood and affect.    Ettel Albergo L. Tarri Glenn, MD, MPH 12/11/2022, 10:47 AM

## 2022-12-11 ENCOUNTER — Encounter: Payer: Self-pay | Admitting: Gastroenterology

## 2022-12-11 ENCOUNTER — Ambulatory Visit: Payer: Medicare HMO | Admitting: Gastroenterology

## 2022-12-11 VITALS — BP 122/76 | HR 94 | Ht 69.0 in | Wt 150.0 lb

## 2022-12-11 DIAGNOSIS — R194 Change in bowel habit: Secondary | ICD-10-CM | POA: Diagnosis not present

## 2022-12-11 MED ORDER — NA SULFATE-K SULFATE-MG SULF 17.5-3.13-1.6 GM/177ML PO SOLN: 1.0000 | Freq: Once | ORAL | 0 refills | Status: AC

## 2022-12-11 NOTE — Patient Instructions (Signed)
_______________________________________________________  If your blood pressure at your visit was 140/90 or greater, please contact your primary care physician to follow up on this.  _______________________________________________________  If you are age 48 or older, your body mass index should be between 23-30. Your Body mass index is 22.15 kg/m. If this is out of the aforementioned range listed, please consider follow up with your Primary Care Provider.  If you are age 65 or younger, your body mass index should be between 19-25. Your Body mass index is 22.15 kg/m. If this is out of the aformentioned range listed, please consider follow up with your Primary Care Provider.   ________________________________________________________  The [facility] providers would like to encourage you to use [facility] to communicate with providers for non-urgent requests or questions.  Due to long hold times on the telephone, sending your provider a message by [facility] may be a faster and more efficient way to get a response.  Please allow 48 business hours for a response.  Please remember that this is for non-urgent requests.  _______________________________________________________  [name] have been scheduled for a colonoscopy. Please follow written instructions given to you at your visit today.  Please pick up your prep supplies at the pharmacy within the next 1-3 days. If you use inhalers (even only as needed), please bring them with you on the day of your procedure.

## 2023-01-14 ENCOUNTER — Encounter: Payer: Self-pay | Admitting: Gastroenterology

## 2023-01-14 ENCOUNTER — Encounter: Payer: Medicare HMO | Admitting: Gastroenterology

## 2023-01-23 ENCOUNTER — Ambulatory Visit (AMBULATORY_SURGERY_CENTER): Payer: Medicare HMO | Admitting: Gastroenterology

## 2023-01-23 ENCOUNTER — Encounter: Payer: Self-pay | Admitting: Gastroenterology

## 2023-01-23 VITALS — BP 124/73 | HR 84 | Temp 98.0°F | Resp 21 | Ht 69.0 in | Wt 150.0 lb

## 2023-01-23 DIAGNOSIS — K635 Polyp of colon: Secondary | ICD-10-CM | POA: Diagnosis not present

## 2023-01-23 DIAGNOSIS — D120 Benign neoplasm of cecum: Secondary | ICD-10-CM | POA: Diagnosis not present

## 2023-01-23 DIAGNOSIS — D122 Benign neoplasm of ascending colon: Secondary | ICD-10-CM

## 2023-01-23 DIAGNOSIS — Z1211 Encounter for screening for malignant neoplasm of colon: Secondary | ICD-10-CM | POA: Diagnosis present

## 2023-01-23 MED ORDER — SODIUM CHLORIDE 0.9 % IV SOLN: 500.0000 mL | Freq: Once | INTRAVENOUS | Status: DC

## 2023-01-23 NOTE — Op Note (Signed)
[facility] Patient Name: Jon Reeves Procedure Date: 01/23/2023 11:12 AM MRN: [id number] Endoscopist: Tressia Danas MD, MD, [id number] Age: 47 Referring MD:  Date of Birth: 05-12-1976 Gender: Male Account #: [id number] Procedure:                Colonoscopy Indications:              Screening for colorectal malignant neoplasm, This                            is the patient's first colonoscopy                           No known family history of colon cancer or polyps Medicines:                Monitored Anesthesia Care Procedure:                Pre-Anesthesia Assessment:                           - Prior to the procedure, a History and Physical                            was performed, and patient medications and                            allergies were reviewed. The patient's tolerance of                            previous anesthesia was also reviewed. The risks                            and benefits of the procedure and the sedation                            options and risks were discussed with the patient.                            All questions were answered, and informed consent                            was obtained. Prior Anticoagulants: The patient has                            taken no anticoagulant or antiplatelet agents. ASA                            Grade Assessment: II - A patient with mild systemic                            disease. After reviewing the risks and benefits,                            the patient was deemed in satisfactory condition to  undergo the procedure.                           After obtaining informed consent, the colonoscope                            was passed under direct vision. Throughout the                            procedure, the patient's blood pressure, pulse, and                            oxygen saturations were monitored continuously. The                            CF [id number] #[id number]  was introduced through the anus                            and advanced to the 3 cm into the ileum. A second                            forward view of the right colon was performed. The                            colonoscopy was performed without difficulty. The                            patient tolerated the procedure well. The quality                            of the bowel preparation was excellent. The                            terminal ileum, ileocecal valve, appendiceal                            orifice, and rectum were photographed. Scope In: 11:27:53 AM Scope Out: 11:44:35 AM Scope Withdrawal Time: 0 hours 12 minutes 46 seconds  Total Procedure Duration: 0 hours 16 minutes 42 seconds  Findings:                 Non-bleeding internal hemorrhoids were found.                           Two flat polyps were found in the ascending colon.                            The polyps were 2 to 3 mm in size. These polyps                            were removed with a cold snare. Resection and                            retrieval were complete. Estimated  blood loss was                            minimal.                           Two sessile polyps were found in the cecum. The                            polyps were 1 mm in size. These polyps were removed                            with a cold snare. Resection and retrieval were                            complete. Estimated blood loss was minimal.                           The exam was otherwise without abnormality on                            direct and retroflexion views. Complications:            No immediate complications. Estimated Blood Loss:     Estimated blood loss was minimal. Impression:               - Non-bleeding internal hemorrhoids.                           - Two 2 to 3 mm polyps in the ascending colon,                            removed with a cold snare. Resected and retrieved.                           - Two 1 mm polyps in  the cecum, removed with a cold                            snare. Resected and retrieved.                           - The examination was otherwise normal on direct                            and retroflexion views. Recommendation:           - Patient has a contact number available for                            emergencies. The signs and symptoms of potential                            delayed complications were discussed with the  patient. Return to normal activities tomorrow.                            Written discharge instructions were provided to the                            patient.                           - Resume previous diet.                           - Continue present medications.                           - Await pathology results.                           - Repeat colonoscopy date to be determined after                            pending pathology results are reviewed for                            surveillance.                           - Emerging evidence supports eating a diet of                            fruits, vegetables, grains, calcium, and yogurt                            while reducing red meat and alcohol may reduce the                            risk of colon cancer.                           - Thank you for allowing me to be involved in your                            colon cancer prevention. Tressia Danas MD, MD 01/23/2023 11:51:39 AM This report has been signed electronically.

## 2023-01-23 NOTE — Progress Notes (Signed)
Pt's states no medical or surgical changes since previsit or office visit. 

## 2023-01-23 NOTE — Progress Notes (Signed)
Referring Provider: Wellness, Duanne [facility]ick* Primary Care Physician:  Wellness, [facility]  Indication for Colonoscopy:  Colon cancer screening   IMPRESSION:  Need for colon cancer screening Appropriate candidate for monitored anesthesia care  PLAN: Colonoscopy in the [facility] today   HPI: Jon Reeves is a 47 y.o. adult presents for screening colonoscopy.  Recent change in bowel habits. No prior colonoscopy or colon cancer screening.  No known family history of colon cancer or polyps. No family history of uterine/endometrial cancer, pancreatic cancer or gastric/stomach cancer.   Past Medical History:  Diagnosis Date   Abdominal wall hernia 05/2022   Cluster headache    Complication of anesthesia    COPD (chronic obstructive pulmonary disease)    mild case -was told this   Epilepsy    MS (multiple sclerosis)    Nerve pain    in legs   PONV (postoperative nausea and vomiting)    Seizures    no medications for this for 20 years   Trigeminal neuralgia     Past Surgical History:  Procedure Laterality Date   CHOLECYSTECTOMY N/A 06/07/2017   Procedure: LAPAROSCOPIC CHOLECYSTECTOMY WITH INTRAOPERATIVE CHOLANGIOGRAM;  Surgeon: Avel Peace, MD;  Location: [facility];  Service: General;  Laterality: N/A;   UPPER GASTROINTESTINAL ENDOSCOPY     WISDOM TOOTH EXTRACTION      Current Outpatient Medications  Medication Sig Dispense Refill   acetaminophen (TYLENOL) 500 MG tablet Take 1,000 mg by mouth 2 (two) times daily as needed for moderate pain or headache.     Esterified Estrogens 2.5 MG TABS Take 1.5 mg by mouth.     Vitamin D, Ergocalciferol, (DRISDOL) 50000 units CAPS capsule Take 50,000 Units by mouth every Monday.  1   baclofen (LIORESAL) 10 MG tablet Take 10 mg by mouth 2 (two) times daily as needed for muscle spasms.     carbamazepine (TEGRETOL) 100 MG chewable tablet Chew 100 mg by mouth 2 (two) times daily as needed (Trigeminal neuralgia).      cyclobenzaprine (FLEXERIL) 10 MG tablet Take 1 tablet (10 mg total) by mouth 3 (three) times daily as needed for muscle spasms. 15 tablet 0   EPINEPHrine 0.3 mg/0.3 mL IJ SOAJ injection Inject into the muscle.     gabapentin (NEURONTIN) 300 MG capsule Take 900 mg by mouth 3 (three) times daily.      ondansetron (ZOFRAN) 4 MG tablet Take 1 tablet (4 mg total) by mouth every 4 (four) hours as needed for nausea or vomiting. 20 tablet 0   oxyCODONE (OXY IR/ROXICODONE) 5 MG immediate release tablet Take 1-2 tablets (5-10 mg total) by mouth every 4 (four) hours as needed for moderate pain, severe pain or breakthrough pain. 40 tablet 0   Current Facility-Administered Medications  Medication Dose Route Frequency Provider Last Rate Last Admin   0.9 %  sodium chloride infusion  500 mL Intravenous Once Tressia Danas, MD        Allergies as of 01/23/2023 - Review Complete 01/23/2023  Allergen Reaction Noted   Adhesive [tape] Other (See Comments) 07/21/2013   Penicillins Nausea And Vomiting 09/20/2011    Family History  Problem Relation Age of Onset   Diabetes Mother    Hyperlipidemia Mother    Heart attack Neg Hx    Hypertension Neg Hx    Sudden death Neg Hx    Liver disease Neg Hx    Colon cancer Neg Hx    Esophageal cancer Neg Hx  Physical Exam: General:   Alert,  well-nourished, pleasant and cooperative in NAD Head:  Normocephalic and atraumatic. Eyes:  Sclera clear, no icterus.   Conjunctiva pink. Mouth:  No deformity or lesions.   Neck:  Supple; no masses or thyromegaly. Lungs:  Clear throughout to auscultation.   No wheezes. Heart:  Regular rate and rhythm; no murmurs. Abdomen:  Soft, non-tender, nondistended, normal bowel sounds, no rebound or guarding.  Msk:  Symmetrical. No boney deformities LAD: No inguinal or umbilical LAD Extremities:  No clubbing or edema. Neurologic:  Alert and  oriented x4;  grossly nonfocal Skin:  No obvious rash or bruise. Psych:  Alert and  cooperative. Normal mood and affect.     Studies/Results: No results found.    [name] L. Orvan Falconer, MD, MPH 01/23/2023, 11:14 AM

## 2023-01-23 NOTE — Patient Instructions (Signed)
Thank you for letting us take care of your healthcare needs today. Please see handouts given to you on Polyps and Hemorrhoids.    YOU HAD AN ENDOSCOPIC PROCEDURE TODAY AT THE [facility]:   Refer to the procedure report that was given to you for any specific questions about what was found during the examination.  If the procedure report does not answer your questions, please call your gastroenterologist to clarify.  If you requested that your care partner not be given the details of your procedure findings, then the procedure report has been included in a sealed envelope for you to review at your convenience later.  YOU SHOULD EXPECT: Some feelings of bloating in the abdomen. Passage of more gas than usual.  Walking can help get rid of the air that was put into your GI tract during the procedure and reduce the bloating. If you had a lower endoscopy (such as a colonoscopy or flexible sigmoidoscopy) you may notice spotting of blood in your stool or on the toilet paper. If you underwent a bowel prep for your procedure, you may not have a normal bowel movement for a few days.  Please Note:  You might notice some irritation and congestion in your nose or some drainage.  This is from the oxygen used during your procedure.  There is no need for concern and it should clear up in a day or so.  SYMPTOMS TO REPORT IMMEDIATELY:  Following lower endoscopy (colonoscopy or flexible sigmoidoscopy):  Excessive amounts of blood in the stool  Significant tenderness or worsening of abdominal pains  Swelling of the abdomen that is new, acute  Fever of 100F or higher   For urgent or emergent issues, a gastroenterologist can be reached at any hour by calling [phone]. Do not use MyChart messaging for urgent concerns.    DIET:  We do recommend a small meal at first, but then you may proceed to your regular diet.  Drink plenty of fluids but you should avoid alcoholic beverages for 24  hours.  ACTIVITY:  You should plan to take it easy for the rest of today and you should NOT DRIVE or use heavy machinery until tomorrow (because of the sedation medicines used during the test).    FOLLOW UP: Our staff will call the number listed on your records the next business day following your procedure.  We will call around 7:15- 8:00 am to check on you and address any questions or concerns that you may have regarding the information given to you following your procedure. If we do not reach you, we will leave a message.     If any biopsies were taken you will be contacted by phone or by letter within the next 1-3 weeks.  Please call us at [phone] if you have not heard about the biopsies in 3 weeks.    SIGNATURES/CONFIDENTIALITY: You and/or your care partner have signed paperwork which will be entered into your electronic medical record.  These signatures attest to the fact that that the information above on your After Visit Summary has been reviewed and is understood.  Full responsibility of the confidentiality of this discharge information lies with you and/or your care-partner. 

## 2023-01-23 NOTE — Progress Notes (Signed)
Uneventful anesthetic. Report to pacu rn. Vss. Care resumed by rn. 

## 2023-01-23 NOTE — Progress Notes (Signed)
Called to room to assist during endoscopic procedure.  Patient ID and intended procedure confirmed with present staff. Received instructions for my participation in the procedure from the performing physician.  

## 2023-01-24 ENCOUNTER — Telehealth: Payer: Self-pay | Admitting: *Deleted

## 2023-01-24 NOTE — Telephone Encounter (Signed)
  Follow up Call-    Row Labels 01/23/2023   10:53 AM  Call back number   Section Header. No data exists in this row.   Post procedure Call Back phone  #   [phone]  Permission to leave phone message   Yes     Patient questions:  Do you have a fever, pain , or abdominal swelling? No. Pain Score  0 *  Have you tolerated food without any problems? Yes.    Have you been able to return to your normal activities? Yes.    Do you have any questions about your discharge instructions: Diet   No. Medications  No. Follow up visit  No.  Do you have questions or concerns about your Care? No.  Actions: * If pain score is 4 or above: No action needed, pain <4.

## 2023-01-30 ENCOUNTER — Telehealth: Payer: Self-pay | Admitting: *Deleted

## 2023-01-30 NOTE — Telephone Encounter (Signed)
-----   Message from Tressia Danas, MD sent at 01/23/2023 11:46 AM EDT ----- Please arrange office follow-up with the next available.  Thank you.  KLB

## 2023-02-09 ENCOUNTER — Encounter: Payer: Self-pay | Admitting: Gastroenterology

## 2023-03-28 ENCOUNTER — Ambulatory Visit (INDEPENDENT_AMBULATORY_CARE_PROVIDER_SITE_OTHER): Payer: Medicare HMO | Admitting: Physician Assistant

## 2023-03-28 ENCOUNTER — Encounter: Payer: Self-pay | Admitting: Physician Assistant

## 2023-03-28 DIAGNOSIS — K5900 Constipation, unspecified: Secondary | ICD-10-CM

## 2023-03-28 DIAGNOSIS — Z8619 Personal history of other infectious and parasitic diseases: Secondary | ICD-10-CM

## 2023-03-28 MED ORDER — AMOXICILLIN 500 MG PO TABS: 1000.0000 mg | ORAL_TABLET | Freq: Two times a day (BID) | ORAL | 0 refills | Status: AC

## 2023-03-28 MED ORDER — CLARITHROMYCIN 500 MG PO TABS: 500.0000 mg | ORAL_TABLET | Freq: Two times a day (BID) | ORAL | 0 refills | Status: AC

## 2023-03-28 MED ORDER — PANTOPRAZOLE SODIUM 40 MG PO TBEC: 40.0000 mg | DELAYED_RELEASE_TABLET | Freq: Two times a day (BID) | ORAL | 0 refills | Status: DC

## 2023-03-28 NOTE — Patient Instructions (Addendum)
_______________________________________________________  If your blood pressure at your visit was 140/90 or greater, please contact your primary care physician to follow up on this.  If you are age 48 or younger, your body mass index should be between 19-25. Your Body mass index is 22.67 kg/m. If this is out of the aformentioned range listed, please consider follow up with your Primary Care Provider.  ________________________________________________________  The [facility] providers would like to encourage you to use [facility] to communicate with providers for non-urgent requests or questions.  Due to long hold times on the telephone, sending your provider a message by [facility] may be a faster and more efficient way to get a response.  Please allow 48 business hours for a response.  Please remember that this is for non-urgent requests.  _______________________________________________________  Please try to push fluids by drinking 60 ounces of water daily.  You can try eating prunes and figs for constipation.   Please purchase the following medications over the counter and take as directed: START: Senekot-S take 1 to 2 tablets at bedtime for constipation.  We have sent the following medications to your pharmacy for you to pick up at your convenience:  START: pantoprazole 40mg  one tablet twice daily for 14 days START: Clarithromycin 500mg  one tablet twice daily for 14 days START: Amoxicillin 1000mg  (2 tablets) one tablet twice daily for 14 days  Please complete all 14 days of antibiotics for Hpylori.  The 1st week in August (around August 5th) you will complete a stool specimen and return to our lab on the basement level of our building.  Thank you for entrusting me with your care and choosing [facility].  Amy Esterwood, PA-C

## 2023-03-28 NOTE — Progress Notes (Signed)
Agree with assessment and plan as outlined.  

## 2023-03-28 NOTE — Progress Notes (Signed)
Subjective:    Patient ID: Jon Reeves, adult    DOB: Nov 01, 1975, 47 y.o.   MRN: [id number]  HPI "Jon Reeves" is a 47 year old transgender male, previously established with Dr. Orvan Falconer.  She had undergone colonoscopy in April 2024 after she had been seen with complaints of constipation. Colonoscopy showed nonbleeding internal hemorrhoids, there were 2 flat polyps in the ascending colon measuring 2 to 3 mm and 2 sessile polyps in the cecum, all removed.  Biopsies showed all of these to be benign colonic mucosa with lymphoid aggregate.  She is indicated for 10-year interval follow-up. She comes in today with ongoing issues with mild constipation and also is concerned about prior history of H. pylori. She says as far as her constipation is concerned she is currently using a magnesium supplement about 300 mg/day she believes magnesium citrate and says that that is helping her bowel some she is usually having a bowel movement every 1 to 2 days and likes to have a bowel movement every day.  She has tried MiraLAX in the past without benefit and also tried fiber which she says seems to make her symptoms worse  She says over the all the constipation improved after she had the colonoscopy done.  She uses Dulcolax suppositories as needed and occasional enemas. She is working on trying to increase fluid intake and continuing to eat a high-fiber diet.  She says she prefers to use nonprescription medicines to manage the constipation. She also relates that she was diagnosed with H. pylori through [facility] in 2022 by breath testing.  She had been given a course of antibiotics but says that she only took them for a couple of days, was on a lot of medications at that time and accidentally had thrown away one of the antibiotics.  She knows that she did not nearly complete a course.  She would like to be reevaluated. No current complaints of epigastric discomfort nausea vomiting dysphagia etc. She does have history of  asthma, remote seizure disorder and has been evaluated for a probable demyelinating disorder/MS.  Review of Systems Pertinent positive and negative review of systems were noted in the above HPI section.  All other review of systems was otherwise negative.   Outpatient Encounter Medications as of 03/28/2023  Medication Sig   acetaminophen (TYLENOL) 500 MG tablet Take 1,000 mg by mouth 2 (two) times daily as needed for moderate pain or headache.   amoxicillin (AMOXIL) 500 MG tablet Take 2 tablets (1,000 mg total) by mouth 2 (two) times daily for 14 days.   baclofen (LIORESAL) 10 MG tablet Take 10 mg by mouth 2 (two) times daily as needed for muscle spasms.   carbamazepine (TEGRETOL) 100 MG chewable tablet Chew 100 mg by mouth 2 (two) times daily as needed (Trigeminal neuralgia).   clarithromycin (BIAXIN) 500 MG tablet Take 1 tablet (500 mg total) by mouth 2 (two) times daily for 14 days.   cyclobenzaprine (FLEXERIL) 10 MG tablet Take 1 tablet (10 mg total) by mouth 3 (three) times daily as needed for muscle spasms.   EPINEPHrine 0.3 mg/0.3 mL IJ SOAJ injection Inject into the muscle.   Esterified Estrogens 2.5 MG TABS Take 1.5 mg by mouth.   Estrone (ESTRO-5 IM) Inject into the muscle. Estrogen injections 0.15 every 7 days.   gabapentin (NEURONTIN) 300 MG capsule Take 900 mg by mouth 3 (three) times daily.    ondansetron (ZOFRAN) 4 MG tablet Take 1 tablet (4 mg total) by mouth every  4 (four) hours as needed for nausea or vomiting.   oxyCODONE (OXY IR/ROXICODONE) 5 MG immediate release tablet Take 1-2 tablets (5-10 mg total) by mouth every 4 (four) hours as needed for moderate pain, severe pain or breakthrough pain.   pantoprazole (PROTONIX) 40 MG tablet Take 1 tablet (40 mg total) by mouth 2 (two) times daily for 14 days.   progesterone (PROMETRIUM) 200 MG capsule Take 200 mg by mouth at bedtime.   Vitamin D, Ergocalciferol, (DRISDOL) 50000 units CAPS capsule Take 50,000 Units by mouth every Monday.    No facility-administered encounter medications on file as of 03/28/2023.   Allergies  Allergen Reactions   Glatiramer Anaphylaxis   Adhesive [Tape] Other (See Comments)    Burns - use paper tape   Penicillins Nausea And Vomiting    Has patient had a PCN reaction causing immediate rash, facial/tongue/throat swelling, SOB or lightheadedness with hypotension: No Has patient had a PCN reaction causing severe rash involving mucus membranes or skin necrosis: No Has patient had a PCN reaction that required hospitalization: No Has patient had a PCN reaction occurring within the last 10 years: No If all of the above answers are "NO", then may proceed with Cephalosporin use.    Patient Active Problem List   Diagnosis Date Noted   Back pain 08/18/2013   Neck pain 08/18/2013   Left shoulder pain 01/15/2013   Social History   Socioeconomic History   Marital status: Legally Separated    Spouse name: Not on file   Number of children: 3   Years of education: Not on file   Highest education level: Not on file  Occupational History   Occupation: disable  Tobacco Use   Smoking status: Former    Types: Cigarettes, E-cigarettes   Smokeless tobacco: Never   Tobacco comments:    "vape"  Vaping Use   Vaping Use: Some days  Substance and Sexual Activity   Alcohol use: Yes    Comment: rarely   Drug use: Yes    Types: Marijuana    Comment: last used 1 week   Sexual activity: Not on file  Other Topics Concern   Not on file  Social History Narrative   Not on file   Social Determinants of Health   Financial Resource Strain: Not on file  Food Insecurity: Not on file  Transportation Needs: Not on file  Physical Activity: Not on file  Stress: Not on file  Social Connections: Not on file  Intimate Partner Violence: Not on file    Jon Reeves's family history includes Diabetes in her mother; Hyperlipidemia in her mother.      Objective:    Vitals:   03/28/23 1004  BP: 100/74   Pulse: 100  SpO2: 97%    Physical Exam Well-developed well-nourished male  in no acute distress.  Height, Weight, 158 BMI 22.67  HEENT; nontraumatic normocephalic, EOMI, PE R LA, sclera anicteric. Oropharynx;not done Neck; supple, no JVD Cardiovascular; regular rate and rhythm with S1-S2, no murmur rub or gallop Pulmonary; Clear bilaterally Abdomen; soft, nontender, nondistended, no palpable mass or hepatosplenomegaly, bowel sounds are active Rectal;not done Skin; benign exam, no jaundice rash or appreciable lesions Extremities; no clubbing cyanosis or edema skin warm and dry Neuro/Psych; alert and oriented x4, grossly nonfocal mood and affect appropriate        Assessment & Plan:   #55 47 year old transgender male with mild constipation/functional/medication induced. Proved since she underwent screening colonoscopy in April 2024. Currently using a  magnesium citrate tablet supplement 300 mg daily, she uses Dulcolax suppository as needed and/or an enema. Previous trials of fiber without benefit and MiraLAX did not help. Usually having a bowel movement every day to every other day. Prefers nonprescription management of the constipation at this time.  #2 screening colonoscopy April 2024 for diminutive polyps, biopsies showed all 4 to be benign colonic mucosa  with lymphoid aggregate and indicated for 10-year interval follow-up  #3 H. pylori +2022 through breath testing done elsewhere-patient relates she did not complete a course of therapy  Plan; we discussed liberal water intake, at least 60 ounces per day, continue high-fiber diet May try dried prunes or figs 4 to 5/day.  To help with constipation Continue magnesium citrate supplement 400 mg/day Add Senokot S1-2 at bedtime. Patient encouraged to call if this regimen is not working well and she wishes to try a trial of a promotility agent i.e. Linzess or Trulance. \ Will retreat for H. pylori with Protonix 40 mg p.o. twice  daily x 14 days Clarithromycin 500 mg p.o. twice daily x 14 days Amoxicillin 1000 mg p.o. twice daily x 14 days  Plan H. pylori stool antigen 1 month after she completes above regimen.  Will be established with Dr. Adela Lank, happy to see her in follow-up as needed    Jon Reeves Oswald Hillock PA-C 03/28/2023   Cc: [facility]ness, [facility] [employer]*

## 2023-12-15 NOTE — Progress Notes (Unsigned)
 [facility] Return Visit   Referring Provider Wellness, [facility] [address] [place],  [place] [address]  Primary Care Provider Wellness, [facility]  Patient Profile: Jon Reeves is a 48 y.o. adult with a past medical history noteworthy for COPD, epilepsy, demyelinating disease/multiple sclerosis, trigeminal neuralgia, cluster headache who returns to the [facility] for follow-up of the problem(s) noted below.  Problem List: Functional constipation H. pylori positive in 2022 via breath test Previous history of duodenal ulcer Chronic abdominal pain-epigastric and bilateral lower quadrant Colon cancer screening-April 2024 benign polyps-next due 2034   History of Present Illness   Jon Reeves was last seen in the GI office 03/28/2023   Current GI Meds  Magnesium citrate Dulcolax  Interval History   Epigastric abdominal pain/H. pylori infection -- Reports epigastric discomfort and fullness that feels similar to pain around the time of cholecystectomy -- Describes a "empty feeling" in her stomach which is uncomfortable -- No nausea or vomiting  -- At the last visit was given prescriptions for H. pylori treatment with: Protonix, clarithromycin and amoxicillin x 14 days -- States that she completed most of it but comments that it was difficult to take the entire course of treatment -- Reports submitting H. pylori stool antigen for test of cure to laboratory at [facility] -will need to follow-up results -- States that she was first diagnosed with H. pylori in the 1990s and described to have an associated duodenal ulcer -- Reviewed that current strain of H. pylori could be resistant to antibiotics and EGD with biopsies for culture and sensitivity may be beneficial  Constipation and chronic abdominal pain -- Endorses chronic bilateral lower quadrant pain greater on right than left -- States  that she can feel a bulge on the right side of the abdomen but no obvious hernia -- Constipation currently well-managed with magnesium citrate and Dulcolax -- Reports stooling on a daily basis without straining -- No blood or mucus in stool -- Normal colonoscopy performed for 2024 as outlined below  Last colonoscopy: 01/2023 -4 lymphoid aggregates removed Last endoscopy: None  Last Abd CT/CTE/MRE: CTAP 07/2022 - normal  GI Review of Symptoms Significant for abdominal pain in the epigastrium and bilateral lower quadrant. Otherwise negative.  General Review of Systems  Review of systems is significant for the pertinent positives and negatives as listed per the HPI.  Full ROS is otherwise negative.  Past Medical History   Past Medical History:  Diagnosis Date   Abdominal wall hernia 05/2022   Cluster headache    Complication of anesthesia    COPD (chronic obstructive pulmonary disease) (HCC)    mild case -was told this   Epilepsy (HCC)    MS (multiple sclerosis) (HCC)    Nerve pain    in legs   PONV (postoperative nausea and vomiting)    Seizures (HCC)    no medications for this for 20 years   Trigeminal neuralgia      Past Surgical History   Past Surgical History:  Procedure Laterality Date   CHOLECYSTECTOMY N/A 06/07/2017   Procedure: LAPAROSCOPIC CHOLECYSTECTOMY WITH INTRAOPERATIVE CHOLANGIOGRAM;  Surgeon: Avel Peace, MD;  Location: [facility];  Service: General;  Laterality: N/A;   UPPER GASTROINTESTINAL ENDOSCOPY     WISDOM TOOTH EXTRACTION       Allergies and Medications   Allergies  Allergen Reactions   Glatiramer Anaphylaxis   Adhesive [Tape] Other (See Comments)  Burns - use paper tape   Penicillins Nausea And Vomiting    Has patient had a PCN reaction causing immediate rash, facial/tongue/throat swelling, SOB or lightheadedness with hypotension: No Has patient had a PCN reaction causing severe rash involving mucus membranes or skin necrosis: No Has  patient had a PCN reaction that required hospitalization: No Has patient had a PCN reaction occurring within the last 10 years: No If all of the above answers are "NO", then may proceed with Cephalosporin use.    Current Meds  Medication Sig   acetaminophen (TYLENOL) 500 MG tablet Take 1,000 mg by mouth 2 (two) times daily as needed for moderate pain or headache.   cyclobenzaprine (FLEXERIL) 10 MG tablet Take 1 tablet (10 mg total) by mouth 3 (three) times daily as needed for muscle spasms.   dicyclomine (BENTYL) 10 MG capsule Take 1 capsule (10 mg total) by mouth every 8 (eight) hours as needed for spasms.   EPINEPHrine 0.3 mg/0.3 mL IJ SOAJ injection Inject into the muscle.   Esterified Estrogens 2.5 MG TABS Take 1.5 mg by mouth.   Estrone (ESTRO-5 IM) Inject into the muscle. Estrogen injections 0.15 every 7 days.   gabapentin (NEURONTIN) 300 MG capsule Take 900 mg by mouth 3 (three) times daily.    ondansetron (ZOFRAN) 4 MG tablet Take 1 tablet (4 mg total) by mouth every 4 (four) hours as needed for nausea or vomiting.   oxyCODONE (OXY IR/ROXICODONE) 5 MG immediate release tablet Take 1-2 tablets (5-10 mg total) by mouth every 4 (four) hours as needed for moderate pain, severe pain or breakthrough pain.   progesterone (PROMETRIUM) 200 MG capsule Take 200 mg by mouth at bedtime.   Vitamin D, Ergocalciferol, (DRISDOL) 50000 units CAPS capsule Take 50,000 Units by mouth every Monday.     Family History   Family History  Problem Relation Age of Onset   Diabetes Mother    Hyperlipidemia Mother    Heart attack Neg Hx    Hypertension Neg Hx    Sudden death Neg Hx    Liver disease Neg Hx    Colon cancer Neg Hx    Esophageal cancer Neg Hx     Social History   Social History   Tobacco Use   Smoking status: Former    Types: Cigarettes, E-cigarettes   Smokeless tobacco: Never   Tobacco comments:    "vape"  Vaping Use   Vaping status: Some Days  Substance Use Topics   Alcohol  use: Yes    Comment: rarely   Drug use: Yes    Types: Marijuana    Comment: last used 1 week   Jon Reeves reports that she has quit smoking. Her smoking use included cigarettes and e-cigarettes. She has never used smokeless tobacco. She reports current alcohol use. She reports current drug use. Drug: Marijuana.  Vital Signs and Physical Examination   Vitals:   12/16/23 1404  BP: 118/62  Pulse: 100   Body mass index is 23.63 kg/m. Weight: 160 lb (72.6 kg)  General: Well developed, well nourished, no acute distress Head: Normocephalic and atraumatic Eyes: Sclerae anicteric, EOMI Lungs: Clear throughout to auscultation Heart: Regular rate and rhythm; No murmurs, rubs or bruits Abdomen: Soft, non tender and non distended. No masses, hepatosplenomegaly or hernias noted. Normal Bowel sounds Rectal: Deferred Musculoskeletal: Symmetrical with no gross deformities   Review of Data  The following data was reviewed at the time of this encounter:  Laboratory Studies  Latest Ref Rng & Units 07/21/2022    1:25 PM 10/31/2021   11:20 PM 06/25/2017   10:57 PM  CBC  WBC 4.0 - 10.5 K/uL 8.0  8.6  6.6   Hemoglobin 13.0 - 17.0 g/dL 13.0  86.5  78.4   Hematocrit 39.0 - 52.0 % 41.6  39.7  44.9   Platelets 150 - 400 K/uL 260  242  238     Lab Results  Component Value Date   LIPASE 32 05/31/2017      Latest Ref Rng & Units 07/21/2022    1:25 PM 10/31/2021   11:20 PM 06/25/2017   10:57 PM  CMP  Glucose 70 - 99 mg/dL 696  295  85   BUN 6 - 20 mg/dL 9  13  12    Creatinine 0.61 - 1.24 mg/dL 2.84  1.32  4.40   Sodium 135 - 145 mmol/L 133  133  137   Potassium 3.5 - 5.1 mmol/L 3.6  3.6  3.8   Chloride 98 - 111 mmol/L 103  103  103   CO2 22 - 32 mmol/L 23  22  27    Calcium 8.9 - 10.3 mg/dL 8.5  8.7  9.1   Total Protein 6.5 - 8.1 g/dL   6.7   Total Bilirubin 0.3 - 1.2 mg/dL   0.9   Alkaline Phos 38 - 126 U/L   78   AST 15 - 41 U/L   17   ALT 17 - 63 U/L   21      Imaging Studies   CTAP 07/2022 No evidence of acute or significant abnormality. No CT findings to suggest a cause for this patient's RIGHT abdominal pain.  CTAP 07/2021 1. No non-contrast CT findings of the abdomen or pelvis to explain right lower quadrant pain. Normal appendix. 2. Status post cholecystectomy.   CTAP 06/2017 1. No acute abnormality or complication post recent cholecystectomy. 2. Nonobstructing left nephrolithiasis.  RUQ [place] 05/2017 Very contracted gallbladder which limits the study. Possible small amount of intraluminal sludge. Gallbladder wall is thickened but this is likely augmented by contracted state. Positive sonographic Murphy's. HIDA scan could be helpful for further evaluation to evaluate for acute cholecystitis. Negative for biliary dilatation.  CTAP 05/2014 No renal, ureteral, or bladder calculi.  No hydronephrosis.  No evidence of bowel obstruction.  Normal appendix.  No CT findings to account for the patient's right flank pain.    GI Procedures and Studies  Colonoscopy  Four lymphoid aggregates removed, IH  Clinical Impression  It is my clinical impression that Ms. Garverick is a 47 y.o. adult with;  Functional constipation H. pylori positive in 2022 via breath test Previous history of duodenal ulcer Chronic abdominal pain-epigastric and bilateral lower quadrant Colon cancer screening-April 2024 benign polyps-next due 2034  Amil Amen returns to the office today for follow-up of multiple GI issues as outlined above.  At today's visit we focused a large portion of time reviewing her history of H. pylori infection which apparently dates back to the 1990s and was associated with a duodenal ulcer.  She has been treated for H. pylori in the past, most recently in summer 2024 with Protonix, clarithromycin and amoxicillin.  States that she performed a test of cure in the form of an H. pylori stool antigen and and submitted it to one of the labs and equal.  Will attempt to obtain this  result to determine if there is persistent H. pylori infection.  Due to ongoing symptoms  we discussed performing an upper endoscopy which would permit [place] the ability to biopsy for H. pylori if still present and perform culture and sensitivity as well as assessing for gastritis, duodenitis, esophagitis, celiac disease.  She is amenable to proceeding.  Amil Amen has also experienced functional constipation in conjunction with bilateral lower quadrant abdominal pain.  Constipation is improved with the use of magnesium citrate and Dulcolax.  Despite this she continues to note abdominal pain.  Colonoscopy performed for 2024 did not show intraluminal abnormalities to account for abdominal pain.  At today's visit we discussed diagnoses of IBS and abdominal migraine.  Amil Amen is currently on amitriptyline 10 mg as needed for cluster headaches.  States that the medication makes her excessively sleepy and therefore would not want to take it on a regular basis.  Reviewed other options for managing chronic abdominal pain including Bentyl, IBgard and peppermint oil.  Plan  Obtain lab results from [facility] regarding H. pylori stool antigen from this past summer for test of cure Schedule EGD at [facility] -biopsies for H. pylori, celiac disease, EOE; consider H. pylori culture and sensitivity if H. pylori stool antigen remains if Continue magnesium citrate and Dulcolax for management of constipation Prescription sent in today for Bentyl 10 mg p.o. 3 times daily as needed for abdominal pain and cramping Samples provided for Ibgard Discussed use of peppermint oil for alleviation of abdominal pain and cramping Up-to-date for colon cancer screening-next due 2034  Planned Follow Up Return in about 3 months (around 03/17/2024).  The patient or caregiver verbalized understanding of the material covered, with no barriers to understanding. All questions were answered. Patient or caregiver is agreeable with the plan outlined above.    It  was a pleasure to see Amil Amen.  If you have any questions or concerns regarding this evaluation, do not hesitate to contact me.  Maren Beach, MD [facility]   I spent total of 35 minutes in both face-to-face and non-face-to-face activities, excluding procedures performed, for the visit on the date of this encounter.

## 2023-12-16 ENCOUNTER — Ambulatory Visit: Payer: Medicare HMO | Admitting: Pediatrics

## 2023-12-16 ENCOUNTER — Encounter: Payer: Self-pay | Admitting: Pediatrics

## 2023-12-16 VITALS — BP 118/62 | HR 100 | Ht 69.0 in | Wt 160.0 lb

## 2023-12-16 DIAGNOSIS — K5904 Chronic idiopathic constipation: Secondary | ICD-10-CM | POA: Diagnosis not present

## 2023-12-16 DIAGNOSIS — K5900 Constipation, unspecified: Secondary | ICD-10-CM

## 2023-12-16 DIAGNOSIS — G8929 Other chronic pain: Secondary | ICD-10-CM

## 2023-12-16 DIAGNOSIS — Z8619 Personal history of other infectious and parasitic diseases: Secondary | ICD-10-CM | POA: Diagnosis not present

## 2023-12-16 DIAGNOSIS — R1013 Epigastric pain: Secondary | ICD-10-CM | POA: Diagnosis not present

## 2023-12-16 DIAGNOSIS — R1032 Left lower quadrant pain: Secondary | ICD-10-CM | POA: Diagnosis not present

## 2023-12-16 DIAGNOSIS — R1031 Right lower quadrant pain: Secondary | ICD-10-CM | POA: Diagnosis not present

## 2023-12-16 DIAGNOSIS — R109 Unspecified abdominal pain: Secondary | ICD-10-CM

## 2023-12-16 DIAGNOSIS — Z8711 Personal history of peptic ulcer disease: Secondary | ICD-10-CM

## 2023-12-16 MED ORDER — DICYCLOMINE HCL 10 MG PO CAPS: 10.0000 mg | ORAL_CAPSULE | Freq: Three times a day (TID) | ORAL | 1 refills | Status: AC | PRN

## 2023-12-16 NOTE — Patient Instructions (Addendum)
 _______________________________________________________  If your blood pressure at your visit was 140/90 or greater, please contact your primary care physician to follow up on this.  _______________________________________________________  If you are age 49 or older, your body mass index should be between 23-30. Your Body mass index is 23.63 kg/m. If this is out of the aforementioned range listed, please consider follow up with your Primary Care Provider.  If you are age 59 or younger, your body mass index should be between 19-25. Your Body mass index is 23.63 kg/m. If this is out of the aformentioned range listed, please consider follow up with your Primary Care Provider.   ________________________________________________________  The [facility] providers would like to encourage you to use [facility] to communicate with providers for non-urgent requests or questions.  Due to long hold times on the telephone, sending your provider a message by [facility] may be a faster and more efficient way to get a response.  Please allow 48 business hours for a response.  Please remember that this is for non-urgent requests.  _______________________________________________________  We have sent the following medications to your pharmacy for you to pick up at your convenience: Bentyl 1 tablet 3 times a day as needed(every 8 hours as needed)  Please purchase the following medications over the counter and take as directed: Ibgard-take as directed  Please follow up in 3-4 months. Give [place] a call at [phone] to schedule an appointment.  You have been scheduled for an endoscopy. Please follow written instructions given to you at your visit today.  If you use inhalers (even only as needed), please bring them with you on the day of your procedure.  If you take any of the following medications, they will need to be adjusted prior to your procedure:   DO NOT TAKE 7 DAYS PRIOR TO TEST- Trulicity  (dulaglutide) Ozempic, Wegovy (semaglutide) Mounjaro (tirzepatide) Bydureon Bcise (exanatide extended release)  DO NOT TAKE 1 DAY PRIOR TO YOUR TEST Rybelsus (semaglutide) Adlyxin (lixisenatide) Victoza (liraglutide) Byetta (exanatide) ___________________________________________________________________________  It was a pleasure to see you today!  Thank you for trusting me with your gastrointestinal care!

## 2023-12-18 ENCOUNTER — Telehealth: Payer: Self-pay | Admitting: *Deleted

## 2023-12-18 DIAGNOSIS — Z8619 Personal history of other infectious and parasitic diseases: Secondary | ICD-10-CM

## 2023-12-18 NOTE — Telephone Encounter (Signed)
 I have spoken to patient regarding H Pylori testing. They are willing to pick up a specimen container at our office and return for testing. Lab orders entered.

## 2023-12-18 NOTE — Telephone Encounter (Signed)
-----   Message from Ottie Glazier sent at 12/17/2023  7:16 PM EST ----- Regarding: RE: H. pylori stool antigen result at [facility] Yes, please see if they would be willing to submit an H. pylori stool antigen with [place].  If the H. Pylori stool antigen is positive then I will add on additional biopsies to their future endoscopy later this month for culture and sensitivity of H. Pylori in an effort to tailor antibiotic treatment.  Harriett Sine ----- Message ----- From: Richardson Chiquito, RN Sent: 12/17/2023  11:49 AM EST To: Ottie Glazier, MD Subject: RE: H. pylori stool antigen result at [facility]    I contacted patient to see they could tell me which [facility] office the test was completed at. I was told [name] did the testing this past Summer. I contacted [name] but they indicated that the only H Pylori testing they have on file for the patient in their system is from 07/31/21.  Do you need me to have patient complete an H pylori stool antigen with [place] since we cannot find record of previous testing? ----- Message ----- From: Ottie Glazier, MD Sent: 12/16/2023   6:31 PM EST To: Richardson Chiquito, RN Subject: H. pylori stool antigen result at [facility] -  I saw Jon Reeves in the office today follow-up of H. pylori.  They had an H. pylori stool antigen performed in summer 2024 and states that they submitted the specimen to one of the offices at [place] - ?  Maybe PCP?  Can you please contact [facility] to see if we can obtain the result?  Thanks,  Harriett Sine

## 2023-12-25 ENCOUNTER — Encounter: Admitting: Pediatrics

## 2024-01-27 NOTE — Progress Notes (Unsigned)
 [facility] History and Physical   Primary Care Physician:  Wellness, [facility]   Reason for Procedure:  Epigastric abdominal pain, previous history of H. pylori infection status posttreatment  Plan:    EGD   HPI: Jon Reeves is a 48 y.o. adult undergoing upper endoscopy for evaluation of epigastric abdominal discomfort and history of H. pylori infection.  They report a sensation of epigastric discomfort and fullness as well as a "empty" feeling.  No nausea or vomiting.  Previously treated for H. pylori with Protonix, clarithromycin and amoxicillin x 14 days but states it was difficult to complete the course.  Reports first being diagnosed with H. pylori in the 1990s and having an associated duodenal ulcer.   Past Medical History:  Diagnosis Date   Abdominal wall hernia 05/2022   Cluster headache    Complication of anesthesia    COPD (chronic obstructive pulmonary disease) (HCC)    mild case -was told this   Epilepsy (HCC)    MS (multiple sclerosis) (HCC)    Nerve pain    in legs   PONV (postoperative nausea and vomiting)    Seizures (HCC)    no medications for this for 20 years   Trigeminal neuralgia     Past Surgical History:  Procedure Laterality Date   CHOLECYSTECTOMY N/A 06/07/2017   Procedure: LAPAROSCOPIC CHOLECYSTECTOMY WITH INTRAOPERATIVE CHOLANGIOGRAM;  Surgeon: Adalberto Hollow, MD;  Location: [facility];  Service: General;  Laterality: N/A;   UPPER GASTROINTESTINAL ENDOSCOPY     WISDOM TOOTH EXTRACTION      Prior to Admission medications   Medication Sig Start Date End Date Taking? Authorizing Provider  acetaminophen (TYLENOL) 500 MG tablet Take 1,000 mg by mouth 2 (two) times daily as needed for moderate pain or headache.    [provider]  baclofen (LIORESAL) 10 MG tablet Take 10 mg by mouth 2 (two) times daily as needed for muscle spasms. Patient not taking: Reported on 12/16/2023    [provider]   carbamazepine (TEGRETOL) 100 MG chewable tablet Chew 100 mg by mouth 2 (two) times daily as needed (Trigeminal neuralgia). Patient not taking: Reported on 12/16/2023    [provider]  cyclobenzaprine (FLEXERIL) 10 MG tablet Take 1 tablet (10 mg total) by mouth 3 (three) times daily as needed for muscle spasms. 11/02/17   Ward, Clover Dao, DO  dicyclomine (BENTYL) 10 MG capsule Take 1 capsule (10 mg total) by mouth every 8 (eight) hours as needed for spasms. 12/16/23   Truddie Furrow, MD  EPINEPHrine 0.3 mg/0.3 mL IJ SOAJ injection Inject into the muscle. 05/24/18   [provider]  Esterified Estrogens 2.5 MG TABS Take 1.5 mg by mouth.    [provider]  Estrone (ESTRO-5 IM) Inject into the muscle. Estrogen injections 0.15 every 7 days.    [provider]  gabapentin (NEURONTIN) 300 MG capsule Take 900 mg by mouth 3 (three) times daily.     [provider]  ondansetron (ZOFRAN) 4 MG tablet Take 1 tablet (4 mg total) by mouth every 4 (four) hours as needed for nausea or vomiting. 06/07/17   Adalberto Hollow, MD  oxyCODONE (OXY IR/ROXICODONE) 5 MG immediate release tablet Take 1-2 tablets (5-10 mg total) by mouth every 4 (four) hours as needed for moderate pain, severe pain or breakthrough pain. 06/07/17   Adalberto Hollow, MD  pantoprazole (PROTONIX) 40 MG tablet Take 1 tablet (40 mg total) by mouth 2 (two) times daily  for 14 days. 03/28/23 04/11/23  Esterwood, Amy S, PA-C  progesterone (PROMETRIUM) 200 MG capsule Take 200 mg by mouth at bedtime.    [provider]  Vitamin D, Ergocalciferol, (DRISDOL) 50000 units CAPS capsule Take 50,000 Units by mouth every Monday. 04/29/17   [provider]    Current Outpatient Medications  Medication Sig Dispense Refill   acetaminophen (TYLENOL) 500 MG tablet Take 1,000 mg by mouth 2 (two) times daily as needed for moderate pain or headache.     dutasteride (AVODART) 0.5 MG capsule Take 0.5 mg by mouth  daily.     emtricitabine-tenofovir (TRUVADA) 200-300 MG tablet Take 1 tablet by mouth daily.     gabapentin (NEURONTIN) 300 MG capsule Take 900 mg by mouth 3 (three) times daily.      LORazepam (ATIVAN) 1 MG tablet 1 tablet as needed Orally Once a day for 30 days     progesterone (PROMETRIUM) 200 MG capsule Take 200 mg by mouth at bedtime.     baclofen (LIORESAL) 10 MG tablet Take 10 mg by mouth 2 (two) times daily as needed for muscle spasms. (Patient not taking: Reported on 12/16/2023)     carbamazepine (TEGRETOL) 100 MG chewable tablet Chew 100 mg by mouth 2 (two) times daily as needed (Trigeminal neuralgia). (Patient not taking: Reported on 12/16/2023)     cyclobenzaprine (FLEXERIL) 10 MG tablet Take 1 tablet (10 mg total) by mouth 3 (three) times daily as needed for muscle spasms. 15 tablet 0   dicyclomine (BENTYL) 10 MG capsule Take 1 capsule (10 mg total) by mouth every 8 (eight) hours as needed for spasms. 90 capsule 1   EPINEPHrine 0.3 mg/0.3 mL IJ SOAJ injection Inject into the muscle.     Estrone (ESTRO-5 IM) Inject into the muscle. Estrogen injections 0.15 every 7 days.     ondansetron (ZOFRAN) 4 MG tablet Take 1 tablet (4 mg total) by mouth every 4 (four) hours as needed for nausea or vomiting. 20 tablet 0   pantoprazole (PROTONIX) 40 MG tablet Take 1 tablet (40 mg total) by mouth 2 (two) times daily for 14 days. 28 tablet 0   Vitamin D, Ergocalciferol, (DRISDOL) 50000 units CAPS capsule Take 50,000 Units by mouth every Monday.  1   Current Facility-Administered Medications  Medication Dose Route Frequency Provider Last Rate Last Admin   0.9 %  sodium chloride infusion  500 mL Intravenous Once Xander Jutras, Durene Romans, MD        Allergies as of 01/28/2024 - Review Complete 01/28/2024  Allergen Reaction Noted   Glatiramer Anaphylaxis 06/13/2018   Adhesive [tape] Other (See Comments) 07/21/2013   Penicillins Nausea And Vomiting 09/20/2011    Family History  Problem Relation Age of Onset    Diabetes Mother    Hyperlipidemia Mother    Heart attack Neg Hx    Hypertension Neg Hx    Sudden death Neg Hx    Liver disease Neg Hx    Colon cancer Neg Hx    Esophageal cancer Neg Hx     Social History   Socioeconomic History   Marital status: Legally Separated    Spouse name: Not on file   Number of children: 3   Years of education: Not on file   Highest education level: Not on file  Occupational History   Occupation: disable  Tobacco Use   Smoking status: Former    Types: Cigarettes, E-cigarettes   Smokeless tobacco: Never   Tobacco comments:    "  vape"  Vaping Use   Vaping status: Former  Substance and Sexual Activity   Alcohol use: Yes    Comment: rarely   Drug use: Yes    Types: Marijuana    Comment: last used 1 week   Sexual activity: Not on file  Other Topics Concern   Not on file  Social History Narrative   Not on file   Social Drivers of Health   Financial Resource Strain: Not on file  Food Insecurity: Not on file  Transportation Needs: Not on file  Physical Activity: Not on file  Stress: Not on file  Social Connections: Not on file  Intimate Partner Violence: Not on file    Review of Systems:  All other review of systems negative except as mentioned in the HPI.  Physical Exam: Vital signs BP 118/74   Pulse 85   Temp 98.3 F (36.8 C) (Temporal)   Resp 12   Ht 5\' 9"  (1.753 m)   Wt 160 lb (72.6 kg)   SpO2 100%   BMI 23.63 kg/m   General:   Alert,  Well-developed, well-nourished, pleasant and cooperative in NAD Airway:  Mallampati 1 Lungs:  Clear throughout to auscultation.   Heart:  Regular rate and rhythm; no murmurs, clicks, rubs,  or gallops. Abdomen:  Soft, nontender and nondistended. Normal bowel sounds.   Neuro/Psych:  Normal mood and affect. A and O x 3  Eugenia Hess, MD [facility]

## 2024-01-28 ENCOUNTER — Ambulatory Visit: Admitting: Pediatrics

## 2024-01-28 ENCOUNTER — Encounter: Payer: Self-pay | Admitting: Pediatrics

## 2024-01-28 VITALS — BP 134/56 | HR 104 | Temp 98.3°F | Resp 15 | Ht 69.0 in | Wt 160.0 lb

## 2024-01-28 DIAGNOSIS — K2950 Unspecified chronic gastritis without bleeding: Secondary | ICD-10-CM

## 2024-01-28 DIAGNOSIS — Z8719 Personal history of other diseases of the digestive system: Secondary | ICD-10-CM

## 2024-01-28 DIAGNOSIS — K2980 Duodenitis without bleeding: Secondary | ICD-10-CM

## 2024-01-28 DIAGNOSIS — B9681 Helicobacter pylori [H. pylori] as the cause of diseases classified elsewhere: Secondary | ICD-10-CM

## 2024-01-28 DIAGNOSIS — R109 Unspecified abdominal pain: Secondary | ICD-10-CM

## 2024-01-28 DIAGNOSIS — Z8619 Personal history of other infectious and parasitic diseases: Secondary | ICD-10-CM

## 2024-01-28 MED ORDER — ONDANSETRON HCL 40 MG/20ML IJ SOLN
4.0000 mg | Freq: Once | INTRAMUSCULAR | Status: AC
  Administered 2024-01-28: 4 mg via INTRAVENOUS

## 2024-01-28 MED ORDER — SODIUM CHLORIDE 0.9 % IV SOLN: 500.0000 mL | Freq: Once | INTRAVENOUS | Status: DC

## 2024-01-28 NOTE — Progress Notes (Signed)
 At the completion of Jon Reeves's EGD, she had symptoms of retching and coughing as the endoscope was being removed.  After the procedure was noted to have coughing in the recovery area but stable oxygen saturations greater than 95%.  Reported noting some secretions in her throat but no pain or discomfort.  She was given ondansetron to reduce risk of recurrent vomiting/retching as well as albuterol.   I evaluated her prior to her discharge from the recovery area.  She had mild symptoms of coughing but was otherwise clinically stable.  Advised to monitor her respiratory symptoms over the next 24 to 48 hours.  If she develops symptoms of fever, shortness of breath, worsening coughing or chest discomfort advised that she should go to the ER to ensure there is no evidence of aspiration.  She acknowledged understanding of our conversation.

## 2024-01-28 NOTE — Patient Instructions (Signed)
 Resume previous diet and medications. Awaiting pathology results. Return to GI clinic at previously scheduled appointment.  YOU HAD AN ENDOSCOPIC PROCEDURE TODAY AT THE [facility]:   Refer to the procedure report that was given to you for any specific questions about what was found during the examination.  If the procedure report does not answer your questions, please call your gastroenterologist to clarify.  If you requested that your care partner not be given the details of your procedure findings, then the procedure report has been included in a sealed envelope for you to review at your convenience later.  YOU SHOULD EXPECT: Some feelings of bloating in the abdomen. Passage of more gas than usual.  Walking can help get rid of the air that was put into your GI tract during the procedure and reduce the bloating. If you had a lower endoscopy (such as a colonoscopy or flexible sigmoidoscopy) you may notice spotting of blood in your stool or on the toilet paper. If you underwent a bowel prep for your procedure, you may not have a normal bowel movement for a few days.  Please Note:  You might notice some irritation and congestion in your nose or some drainage.  This is from the oxygen used during your procedure.  There is no need for concern and it should clear up in a day or so.  SYMPTOMS TO REPORT IMMEDIATELY: Following upper endoscopy (EGD)  Vomiting of blood or coffee ground material  New chest pain or pain under the shoulder blades  Painful or persistently difficult swallowing  New shortness of breath  Fever of 100F or higher  Black, tarry-looking stools  For urgent or emergent issues, a gastroenterologist can be reached at any hour by calling [phone]. Do not use MyChart messaging for urgent concerns.    DIET:  We do recommend a small meal at first, but then you may proceed to your regular diet.  Drink plenty of fluids but you should avoid alcoholic beverages for 24  hours.  ACTIVITY:  You should plan to take it easy for the rest of today and you should NOT DRIVE or use heavy machinery until tomorrow (because of the sedation medicines used during the test).    FOLLOW UP: Our staff will call the number listed on your records the next business day following your procedure.  We will call around 7:15- 8:00 am to check on you and address any questions or concerns that you may have regarding the information given to you following your procedure. If we do not reach you, we will leave a message.     If any biopsies were taken you will be contacted by phone or by letter within the next 1-3 weeks.  Please call us  at [phone] if you have not heard about the biopsies in 3 weeks.    SIGNATURES/CONFIDENTIALITY: You and/or your care partner have signed paperwork which will be entered into your electronic medical record.  These signatures attest to the fact that that the information above on your After Visit Summary has been reviewed and is understood.  Full responsibility of the confidentiality of this discharge information lies with you and/or your care-partner.

## 2024-01-28 NOTE — Progress Notes (Signed)
 Called to room to assist during endoscopic procedure.  Patient ID and intended procedure confirmed with present staff. Received instructions for my participation in the procedure from the performing physician.

## 2024-01-28 NOTE — Progress Notes (Signed)
 Post procedure, still in room, pt moving aroung and coughing in bed managed to pull IV out 200cc out of bag.  Not restarted

## 2024-01-28 NOTE — Op Note (Signed)
 [facility] Patient Name: Jon Reeves Procedure Date: 01/28/2024 9:53 AM MRN: [id number] Endoscopist: Maren Beach , MD, [id number] Age: 48 Referring MD:  Date of Birth: Jun 05, 1976 Gender: Male Account #: [id number] Procedure:                Upper GI endoscopy Indications:              Generalized abdominal pain, Previously treated for                            Helicobacter pylori, Follow-up of history of                            duodenal ulcer, patient reports that water at home                            recently tested positive for Giardia Medicines:                Monitored Anesthesia Care Procedure:                Pre-Anesthesia Assessment:                           - Prior to the procedure, a History and Physical                            was performed, and patient medications and                            allergies were reviewed. The patient's tolerance of                            previous anesthesia was also reviewed. The risks                            and benefits of the procedure and the sedation                            options and risks were discussed with the patient.                            All questions were answered, and informed consent                            was obtained. Prior Anticoagulants: The patient has                            taken no anticoagulant or antiplatelet agents. ASA                            Grade Assessment: III - A patient with severe                            systemic disease. After reviewing the risks and  benefits, the patient was deemed in satisfactory                            condition to undergo the procedure.                           After obtaining informed consent, the endoscope was                            passed under direct vision. Throughout the                            procedure, the patient's blood pressure, pulse, and                            oxygen saturations  were monitored continuously. The                            GIF [id number] #[id number] was introduced through the                            mouth, and advanced to the second part of duodenum.                            The upper GI endoscopy was accomplished without                            difficulty. The patient tolerated the procedure                            well. Scope In: Scope Out: Findings:                 The examined esophagus was normal.                           The gastric body, gastric antrum, cardia (on                            retroflexion) and gastric fundus (on retroflexion)                            were normal. Biopsies were taken with a cold                            forceps for Helicobacter pylori testing.                           Localized moderate inflammation characterized by                            congestion (edema), erosions and erythema was found                            in the duodenal bulb. Biopsies were taken with a  cold forceps for histology.                           The second portion of the duodenum was normal.                            Biopsies for histology were taken with a cold                            forceps for evaluation of celiac disease and                            Giardia. Complications:            No immediate complications. Estimated blood loss:                            Minimal. Estimated Blood Loss:     Estimated blood loss was minimal. Impression:               - Normal esophagus.                           - Normal gastric body, antrum, cardia and gastric                            fundus. Biopsied. Evaluate for H. pylori.                           - Duodenitis. Biopsied.                           - Normal second portion of the duodenum. Biopsied.                            Evaluate for celiac disease and Giardia. Recommendation:           - Discharge patient to home (ambulatory).                            - Await pathology results.                           - The findings and recommendations were discussed                            with the designated responsible adult.                           - Return to GI clinic as previously scheduled.                           - Patient has a contact number available for                            emergencies. The signs and symptoms of potential  delayed complications were discussed with the                            patient. Return to normal activities tomorrow.                            Written discharge instructions were provided to the                            patient. Eugenia Hess, MD 01/28/2024 10:31:42 AM This report has been signed electronically.

## 2024-01-29 ENCOUNTER — Telehealth: Payer: Self-pay | Admitting: *Deleted

## 2024-01-29 ENCOUNTER — Telehealth: Payer: Self-pay | Admitting: Internal Medicine

## 2024-01-29 NOTE — Telephone Encounter (Signed)
 Patient had EGD yesterday and the records reflect there was some concern about possible aspiration.  Coughing retching and vomiting a towards the end of the procedure.  They called back tonight because they were experiencing  some vague central chest discomfort that seems mild without dyspnea.  He is clearing his throat but not coughing and he has not had a fever that he is aware of.  He did report that he had a fair amount of rhinorrhea and took Benadryl yesterday after the procedure.  I have advised them to monitor their situation if there is deterioration it would be prudent to go to the emergency department overnight otherwise we will call back to reassess tomorrow.

## 2024-01-29 NOTE — Telephone Encounter (Signed)
 Attempted f/u phone call. No answer. Left message.

## 2024-01-30 NOTE — Telephone Encounter (Signed)
 Called and reviewed Dr. Jarold Merlin recommendations with patient. Patient feels that thins will continue to improve but she is aware of ER precautions. Pt verbalized understanding and had no concerns at the end of the call.

## 2024-01-30 NOTE — Telephone Encounter (Signed)
 Called and spoke with patient. Patient states that she feel some tightness in her chest, it is "not bad". Pt states that she got the feeling after eating meat or the first time post procedure. Patient is not sure if it was indigestion. Patient states that she has been having a lot of chest congestion and coughing up a lot of white mucous since procedure. Pt also has a runny nose. Denies any fever or SOB.

## 2024-01-31 LAB — SURGICAL PATHOLOGY

## 2024-02-03 ENCOUNTER — Encounter: Payer: Self-pay | Admitting: Pediatrics

## 2024-02-04 ENCOUNTER — Other Ambulatory Visit (HOSPITAL_COMMUNITY): Payer: Self-pay

## 2024-02-04 ENCOUNTER — Telehealth: Payer: Self-pay

## 2024-02-04 ENCOUNTER — Other Ambulatory Visit: Payer: Self-pay

## 2024-02-04 DIAGNOSIS — A048 Other specified bacterial intestinal infections: Secondary | ICD-10-CM

## 2024-02-04 MED ORDER — BISMUTH/METRONIDAZ/TETRACYCLIN 140-125-125 MG PO CAPS: 3.0000 | ORAL_CAPSULE | Freq: Three times a day (TID) | ORAL | Status: DC

## 2024-02-04 MED ORDER — BISMUTH/METRONIDAZ/TETRACYCLIN 140-125-125 MG PO CAPS: 3.0000 | ORAL_CAPSULE | Freq: Three times a day (TID) | ORAL | 0 refills | Status: DC

## 2024-02-04 MED ORDER — OMEPRAZOLE 20 MG PO CPDR: 20.0000 mg | DELAYED_RELEASE_CAPSULE | Freq: Two times a day (BID) | ORAL | 0 refills | Status: DC

## 2024-02-04 NOTE — Telephone Encounter (Signed)
 Pharmacy Patient Advocate Encounter  Received notification from [facility] [employer] that Prior Authorization for Pylera 140-125-125MG  capsules has been APPROVED from 10-16-2023 to 10-14-2024   PA #/Case ID/Reference #: [id number]

## 2024-02-04 NOTE — Telephone Encounter (Signed)
 Can we please submit PA for Pylera 3 capsules by mouth 4 times a day for 10 days.   Per last office note:

## 2024-02-04 NOTE — Telephone Encounter (Signed)
 PA request has been Submitted. New Encounter has been or will be created for follow up. For additional info see Pharmacy Prior Auth telephone encounter from 02-04-2024.

## 2024-02-04 NOTE — Telephone Encounter (Signed)
 Pylera and Omeprazole  prescriptions sent to pharmacy on file. Patient notified via MyChart.

## 2024-02-04 NOTE — Telephone Encounter (Signed)
 Pharmacy Patient Advocate Encounter   Received notification from Pt Calls Messages that prior authorization for Pylera 140-125-125MG  capsules is required/requested.   Insurance verification completed.   The patient is insured through [facility] [employer] .   Per test claim: PA required; PA submitted to above mentioned insurance via CoverMyMeds Key/confirmation #/EOC [id number] Status is pending

## 2024-02-04 NOTE — Telephone Encounter (Signed)
-----   Message from Truddie Furrow sent at 02/03/2024 10:17 PM EDT ----- Please send in a prescription for Pylera 3 capsules p.o. 4 times daily) after meals and at bedtime) x 10 days.  Please prescribe omeprazole  20 mg p.o. twice daily x 10 days along with this.  If Pylera is not covered by insurance we can send in prescriptions for the components separately-bismuth  subsalicylate, metronidazole and tetracycline.

## 2024-02-05 ENCOUNTER — Other Ambulatory Visit: Payer: Self-pay | Admitting: Pediatrics

## 2024-02-05 DIAGNOSIS — A048 Other specified bacterial intestinal infections: Secondary | ICD-10-CM

## 2024-02-17 ENCOUNTER — Telehealth: Payer: Self-pay | Admitting: *Deleted

## 2024-02-17 NOTE — Telephone Encounter (Signed)
 Left VM in regards to what PCP patient wanted to receive GI Procedure reports.

## 2024-02-17 NOTE — Telephone Encounter (Signed)
 The patient's pharmacy says that Pylera is not covered and they are asking for an alternate medication.

## 2024-02-19 ENCOUNTER — Other Ambulatory Visit: Payer: Self-pay | Admitting: Pediatrics

## 2024-02-19 MED ORDER — METRONIDAZOLE 500 MG PO TABS: 500.0000 mg | ORAL_TABLET | Freq: Four times a day (QID) | ORAL | 0 refills | Status: AC

## 2024-02-19 MED ORDER — OMEPRAZOLE 20 MG PO CPDR: 20.0000 mg | DELAYED_RELEASE_CAPSULE | Freq: Two times a day (BID) | ORAL | 0 refills | Status: DC

## 2024-02-19 MED ORDER — TETRACYCLINE HCL 500 MG PO TABS: 500.0000 mg | ORAL_TABLET | Freq: Four times a day (QID) | ORAL | 0 refills | Status: DC

## 2024-02-19 NOTE — Telephone Encounter (Signed)
 I called the patient and I left a detailed message that the medication had been changed from 1 to 4.  I explained that the 4th medication is Pepto Bismol which she can purchase OTC.  I also sent a detailed MyChart message to her.  I will try calling her again tomorrow.

## 2024-02-19 NOTE — Addendum Note (Signed)
 Addended by: Orlene Bjork on: 02/19/2024 04:35 PM   Modules accepted: Orders

## 2024-02-20 ENCOUNTER — Other Ambulatory Visit: Payer: Self-pay | Admitting: Pediatrics

## 2024-02-20 NOTE — Telephone Encounter (Signed)
 I spoke with Jon Reeves and I explained the treatment that Dr. Yvone Herd sent in to replace the Pylera.  I told her that if she has any problem tolerating the medications, she can let us  know and we will send in Zofran  for her.  I also told her that she will need a test of cure for H-Pylori in 4-8 weeks after she is done.  Patient agreed with the plan of care.

## 2024-03-05 ENCOUNTER — Other Ambulatory Visit: Payer: Self-pay | Admitting: Pediatrics

## 2024-03-10 MED ORDER — OMEPRAZOLE 20 MG PO CPDR: 20.0000 mg | DELAYED_RELEASE_CAPSULE | Freq: Two times a day (BID) | ORAL | 0 refills | Status: AC

## 2024-03-10 NOTE — Telephone Encounter (Signed)
 Looks like Pylera was approved, but pharmacy requested an alternative. See 02/17/24 TE

## 2024-03-10 NOTE — Telephone Encounter (Signed)
 She did not pick up any of the medications.  I had resent the Omeprazole  for 10 days.  I have to send her instructions through MyChart because she was headed into another appointment.

## 2024-03-10 NOTE — Telephone Encounter (Signed)
 I spoke with the [facility] pharmacist and I advised her that the medication has been given a prior authorization.  She rechecked and the brand came up approved and not the generic.  She is filling the prescription for the patient and the cost is $1.60.    I called [facility] and asked had she picked up any of the previous prescriptions and she advised no because it was too expensive.  I explained that her insurance approved the Pylera and her pharmacy said that they will have it ready tomorrow and the cost is $1.60.

## 2024-03-11 ENCOUNTER — Telehealth: Payer: Self-pay

## 2024-03-11 ENCOUNTER — Ambulatory Visit (INDEPENDENT_AMBULATORY_CARE_PROVIDER_SITE_OTHER): Admitting: Neurology

## 2024-03-11 ENCOUNTER — Other Ambulatory Visit (HOSPITAL_COMMUNITY): Payer: Self-pay

## 2024-03-11 ENCOUNTER — Encounter: Payer: Self-pay | Admitting: Neurology

## 2024-03-11 VITALS — BP 141/83 | HR 96 | Ht 69.0 in | Wt 165.5 lb

## 2024-03-11 DIAGNOSIS — R269 Unspecified abnormalities of gait and mobility: Secondary | ICD-10-CM | POA: Diagnosis not present

## 2024-03-11 DIAGNOSIS — N3941 Urge incontinence: Secondary | ICD-10-CM | POA: Diagnosis not present

## 2024-03-11 DIAGNOSIS — R9082 White matter disease, unspecified: Secondary | ICD-10-CM | POA: Insufficient documentation

## 2024-03-11 DIAGNOSIS — R208 Other disturbances of skin sensation: Secondary | ICD-10-CM | POA: Diagnosis not present

## 2024-03-11 DIAGNOSIS — R5383 Other fatigue: Secondary | ICD-10-CM

## 2024-03-11 DIAGNOSIS — E559 Vitamin D deficiency, unspecified: Secondary | ICD-10-CM

## 2024-03-11 DIAGNOSIS — E538 Deficiency of other specified B group vitamins: Secondary | ICD-10-CM

## 2024-03-11 DIAGNOSIS — Z789 Other specified health status: Secondary | ICD-10-CM

## 2024-03-11 MED ORDER — MODAFINIL 200 MG PO TABS: 200.0000 mg | ORAL_TABLET | Freq: Every day | ORAL | 5 refills | Status: AC

## 2024-03-11 NOTE — Progress Notes (Signed)
 [facility]  PATIENT: Jon Reeves DOB: February 03, 1976  REFERRING DOCTOR OR PCP:  Adrain Alar, PA SOURCE: Patient, notes from primary care, notes from [facility] neurology, imaging and lab reports, multiple MRI images personally reviewed.  _________________________________   HISTORICAL  CHIEF COMPLAINT:  Chief Complaint  Patient presents with   New Patient (Initial Visit)    Pt in room 11.alone. Paper referral [facility] for MS. Pt was diagnosed with MS 15 years, pt here to establish care. Pt said last MRI showed brain atrophy and white matter. Pt reports bilateral leg pain, pt in cognitive therapy, pt reports multiple falls, pt reports falls on stairs now. Last eye exam was about 1 year ago.     HISTORY OF PRESENT ILLNESS:  I had the pleasure to see your patient, Jon Reeves, at the [facility] at [facility] for a neurologic consultation regarding her diagnosis of MS MS.  She is a 48 year old transgender male to male who had the onset of balance instability and facial weakness in 2008.  There was also right leg numbness.  Playing the guitar was difficult.   Symptoms improved but no to baseline.   She had an LP reportedly showing paired bands in CSF and serum.    She was seeing Dr. Schuyler Custard at the time and was placed on Copaxone for a couple years.  Due to reactions she was switched to Avonex but this was poorly tolerated.    She has not been on any disease modifying therapy since around 2016.  During that time, there has been some fluctuations in symptoms.  MRI images have been mostly stable though the last brain MRI showed an additional nonspecific white matter focus.  She reports her gait is poor and she has frequent stumbles and occasional falls.   The right leg seems weaker then the left.  There is not spasticity.    There is painful numbness/burning at times in the right foot and leg.  She feels there is also mild hand/arm weakness and clumsiness.   Playing guitar is difficult.      She reports urinary urgency and difficulty completely and often needs to double void and may have dribbling.   There is urgency without hesitancy.      She reports vision is sometimes blurry.   Peripheral vision seemed reduced in 2008 but better over time.      She has depression and anxiety and was once on Wellbutrin with benefot but did not tolerate it well.   A shot of B12 helped mood.   She has noted difficult with auditory processing and organization.  Sometimes has troulbe with articulation. She had neuropsychological testing which identified "largely variable performance in a number of domains, including processing speed, aspects of visual processing and executive functioning, and memory recall, and suggest possible involvement of frontal and/or subcortical neuronal networks that can be involved in multiple sclerosis and other demyelinating conditions."   She reports daily fatigue.    Sleep is usually fine.    She does not snore but has some daytime sleepiness.  She used to get cluster headaches helped by oxygen.   She reports having clear fluid leak from her nose at one point a few years ago.   MRI's I reviewed showed no evidence of intracerebral hypotension (no pachymeningeal enhancement)  She reports a history of arrhythmia.  There has not been an echocardiogram.    Imaging: MRI of the brain 02/14/2024 and 03/20/2022 show some scattered T2/FLAIR  hyperintense foci in the periventricular, subcortical and deep white matter - mostly small round nonspecific.  None of the foci appear to be acute.  There appeared to be 1 more small subcortical focus in 2025 compared to 2023.  MRI of the cervical spine 03/20/2022 showed a normal spinal cord and mild disc bulges at C6-C7 and C5-C6  REVIEW OF SYSTEMS: Constitutional: No fevers, chills, sweats, or change in appetite/   Notes fatigue.   B12 deficiency Eyes: No double vision, eye pain.  SOme blurred vision Ear, nose  and throat: No hearing loss, ear pain, nasal congestion, sore throat Cardiovascular: No chest pain, palpitations Respiratory:  No shortness of breath at rest or with exertion.   No wheezes GastrointestinaI: No nausea, vomiting, diarrhea, abdominal pain, fecal incontinence Genitourinary:  No dysuria.  Some urgency and rare incontinence Musculoskeletal:  No neck pain, back pain Integumentary: No rash, pruritus, skin lesions Neurological: as above Psychiatric: Notes some depression and anxiety Endocrine: No palpitations, diaphoresis, change in appetite, change in weigh or increased thirst Hematologic/Lymphatic:  No anemia, purpura, petechiae. Allergic/Immunologic: No itchy/runny eyes, nasal congestion, recent allergic reactions, rashes  ALLERGIES: Allergies  Allergen Reactions   Glatiramer Anaphylaxis   Adhesive [Tape] Other (See Comments)    Burns - use paper tape   Penicillins Nausea And Vomiting    Has patient had a PCN reaction causing immediate rash, facial/tongue/throat swelling, SOB or lightheadedness with hypotension: No Has patient had a PCN reaction causing severe rash involving mucus membranes or skin necrosis: No Has patient had a PCN reaction that required hospitalization: No Has patient had a PCN reaction occurring within the last 10 years: No If all of the above answers are "NO", then may proceed with Cephalosporin use.     HOME MEDICATIONS:  Current Outpatient Medications:    acetaminophen  (TYLENOL ) 500 MG tablet, Take 1,000 mg by mouth 2 (two) times daily as needed for moderate pain or headache., Disp: , Rfl:    dutasteride (AVODART) 0.5 MG capsule, Take 0.5 mg by mouth daily., Disp: , Rfl:    emtricitabine-tenofovir (TRUVADA) 200-300 MG tablet, Take 1 tablet by mouth daily., Disp: , Rfl:    EPINEPHrine  0.3 mg/0.3 mL IJ SOAJ injection, Inject into the muscle., Disp: , Rfl:    Estrone (ESTRO-5 IM), Inject into the muscle. Estrogen injections 0.15 every 7 days., Disp: ,  Rfl:    gabapentin (NEURONTIN) 300 MG capsule, Take 900 mg by mouth 3 (three) times daily. , Disp: , Rfl:    LORazepam (ATIVAN) 1 MG tablet, 1 tablet as needed Orally Once a day for 30 days, Disp: , Rfl:    modafinil (PROVIGIL) 200 MG tablet, Take 1 tablet (200 mg total) by mouth daily., Disp: 30 tablet, Rfl: 5   progesterone (PROMETRIUM) 200 MG capsule, Take 200 mg by mouth at bedtime., Disp: , Rfl:    Vitamin D, Ergocalciferol, (DRISDOL) 50000 units CAPS capsule, Take 50,000 Units by mouth every Monday., Disp: , Rfl: 1   baclofen (LIORESAL) 10 MG tablet, Take 10 mg by mouth 2 (two) times daily as needed for muscle spasms. (Patient not taking: Reported on 12/16/2023), Disp: , Rfl:    carbamazepine (TEGRETOL) 100 MG chewable tablet, Chew 100 mg by mouth 2 (two) times daily as needed (Trigeminal neuralgia). (Patient not taking: Reported on 12/16/2023), Disp: , Rfl:    cyclobenzaprine  (FLEXERIL ) 10 MG tablet, Take 1 tablet (10 mg total) by mouth 3 (three) times daily as needed for muscle spasms. (Patient not taking: Reported  on 03/11/2024), Disp: 15 tablet, Rfl: 0   dicyclomine  (BENTYL ) 10 MG capsule, Take 1 capsule (10 mg total) by mouth every 8 (eight) hours as needed for spasms. (Patient not taking: Reported on 03/11/2024), Disp: 90 capsule, Rfl: 1   omeprazole  (PRILOSEC) 20 MG capsule, Take 1 capsule (20 mg total) by mouth 2 (two) times daily for 10 days. (Patient not taking: Reported on 03/11/2024), Disp: 20 capsule, Rfl: 0   ondansetron  (ZOFRAN ) 4 MG tablet, Take 1 tablet (4 mg total) by mouth every 4 (four) hours as needed for nausea or vomiting. (Patient not taking: Reported on 03/11/2024), Disp: 20 tablet, Rfl: 0  PAST MEDICAL HISTORY: Past Medical History:  Diagnosis Date   Abdominal wall hernia 05/2022   Cluster headache    Complication of anesthesia    COPD (chronic obstructive pulmonary disease) (HCC)    mild case -was told this   Epilepsy (HCC)    MS (multiple sclerosis) (HCC)    Nerve  pain    in legs   PONV (postoperative nausea and vomiting)    Seizures (HCC)    no medications for this for 20 years   Trigeminal neuralgia     PAST SURGICAL HISTORY: Past Surgical History:  Procedure Laterality Date   CHOLECYSTECTOMY N/A 06/07/2017   Procedure: LAPAROSCOPIC CHOLECYSTECTOMY WITH INTRAOPERATIVE CHOLANGIOGRAM;  Surgeon: Adalberto Hollow, MD;  Location: [facility];  Service: General;  Laterality: N/A;   UPPER GASTROINTESTINAL ENDOSCOPY     WISDOM TOOTH EXTRACTION      FAMILY HISTORY: Family History  Problem Relation Age of Onset   Diabetes Mother    Hyperlipidemia Mother    Heart attack Neg Hx    Hypertension Neg Hx    Sudden death Neg Hx    Liver disease Neg Hx    Colon cancer Neg Hx    Esophageal cancer Neg Hx     SOCIAL HISTORY: Social History   Socioeconomic History   Marital status: Legally Separated    Spouse name: Not on file   Number of children: 3   Years of education: Not on file   Highest education level: Not on file  Occupational History   Occupation: disable  Tobacco Use   Smoking status: Former    Types: Cigarettes, E-cigarettes   Smokeless tobacco: Never   Tobacco comments:    "vape"  Vaping Use   Vaping status: Former  Substance and Sexual Activity   Alcohol use: Not Currently    Comment: rarely   Drug use: Not Currently    Types: Marijuana    Comment: pt reports rare use of smoking of marijuana   Sexual activity: Not on file  Other Topics Concern   Not on file  Social History Narrative   Right handed    Wears glasses    Drinks ginger ale 3-4 per day   Social Drivers of Health   Financial Resource Strain: Not on file  Food Insecurity: Not on file  Transportation Needs: Not on file  Physical Activity: Not on file  Stress: Not on file  Social Connections: Not on file  Intimate Partner Violence: Not on file       PHYSICAL EXAM  Vitals:   03/11/24 0852  BP: (!) 141/83  Pulse: 96  Weight: 165 lb 8 oz (75.1 kg)   Height: 5\' 9"  (1.753 m)    Body mass index is 24.44 kg/m.    General: The patient is well-developed and well-nourished and in no acute distress  HEENT:  Head is [place]/AT.  Sclera are anicteric.  Funduscopic exam shows normal optic discs and retinal vessels.  Neck: No carotid bruits are noted.  The neck is nontender.  Cardiovascular: The heart has a regular rate and rhythm with a normal S1 and S2. There were no murmurs, gallops or rubs.    Skin: Extremities are without rash or  edema.  Musculoskeletal:  Back is nontender  Neurologic Exam  Mental status: The patient is alert and oriented x 3 at the time of the examination. The patient has apparent normal recent and remote memory, with an apparently normal attention span and concentration ability.   Speech is normal.  Cranial nerves: Extraocular movements are full. Pupils are equal, round, and reactive to light and accomodation.  Visual fields are full.  Facial symmetry is present. There is good facial sensation to soft touch bilaterally.Facial strength is normal.  Trapezius and sternocleidomastoid strength is normal. No dysarthria is noted.  The tongue is midline, and the patient has symmetric elevation of the soft palate. No obvious hearing deficits are noted.  Motor:  Muscle bulk is normal.   Tone is normal. Some functionality on exam.  Strength is  5 / 5 ion the left and at least 4+ right.  Arm rolling symmetric.   Aaron Aas   Sensory: Reports reduced sensation to temperature and touch in right arm/leg vs left.  Coordination: Cerebellar testing reveals good finger-nose-finger and heel-to-shin bilaterally.  Gait and station: Station is normal.   Gait is normal. Tandem gait is wide. Romberg is negative.   Reflexes: Deep tendon reflexes are symmetric and normal bilaterally.   Plantar responses are flexor.    DIAGNOSTIC DATA (LABS, IMAGING, TESTING) - I reviewed patient records, labs, notes, testing and imaging myself where  available.  Lab Results  Component Value Date   WBC 8.0 07/21/2022   HGB 14.5 07/21/2022   HCT 41.6 07/21/2022   MCV 91.6 07/21/2022   PLT 260 07/21/2022      Component Value Date/Time   NA 133 (L) 07/21/2022 1325   K 3.6 07/21/2022 1325   CL 103 07/21/2022 1325   CO2 23 07/21/2022 1325   GLUCOSE 129 (H) 07/21/2022 1325   BUN 9 07/21/2022 1325   CREATININE 0.77 07/21/2022 1325   CALCIUM 8.5 (L) 07/21/2022 1325   PROT 6.7 06/25/2017 2257   ALBUMIN 4.0 06/25/2017 2257   AST 17 06/25/2017 2257   ALT 21 06/25/2017 2257   ALKPHOS 78 06/25/2017 2257   BILITOT 0.9 06/25/2017 2257   GFRNONAA >60 07/21/2022 1325   GFRAA >60 06/25/2017 2257       ASSESSMENT AND PLAN  White matter abnormality on MRI of brain - Plan: ECHOCARDIOGRAM COMPLETE BUBBLE STUDY, CANCELED: Vitamin B12  Dysesthesia  Gait disturbance  Urge incontinence  Other fatigue - Plan: VITAMIN D 25 Hydroxy (Vit-D Deficiency, Fractures), CANCELED: Vitamin B12  Male-to-male transgender person  B12 deficiency - Plan: CANCELED: Vitamin B12  Vitamin D deficiency - Plan: VITAMIN D 25 Hydroxy (Vit-D Deficiency, Fractures)   In summary, she is a 48 year old who has had multiple neurologic symptoms since 2008.  MRIs of the brain have shown white matter foci, mostly nonspecific.  I have personally reviewed the imaging studies.  The white matter foci are nonspecific.  Additionally, CSF has not shown oligoclonal bands that are only present in the spinal fluid.  Therefore I feel that the likelihood that this represents MS is no more than 50% and I would not recommend a disease modifying  therapy at this point.  For further evaluation, I do think we need to assess for a patent foramen ovale or other potential cardiac source of the white matter changes on the brain.  We also discussed the possibility of a functional disorder.  Additionally due to fatigue we will check a vitamin D  level.  Due to excessive daytime sleepiness and  fatigue we will do a trial of modafinil .  She will return to see us  in 7 months or sooner if there are new or worsening neurologic symptoms.    This visit is part of a comprehensive longitudinal care medical relationship regarding the patients primary diagnosis of white matter changes in brain and related concerns.    Merline Perkin A. Godwin Lat, MD, [facility] 03/11/2024, 10:14 AM Certified in Neurology, Clinical Neurophysiology, Sleep Medicine and Neuroimaging  [facility] [address], [address] [place], [place] [address] [phone]

## 2024-03-11 NOTE — Telephone Encounter (Signed)
 Pharmacy Patient Advocate Encounter  Received notification from [facility] that Prior Authorization for Modafinil  200MG  tablets has been DENIED.  Full denial letter will be uploaded to the media tab. See denial reason below.     PA #/Case ID/Reference #: PA Case ID #: [id number]

## 2024-03-11 NOTE — Telephone Encounter (Signed)
 Pharmacy Patient Advocate Encounter   Received notification from CoverMyMeds that prior authorization for Modafinil  200MG  tablets is required/requested.   Insurance verification completed.   The patient is insured through [facility] .   Per test claim: PA required; PA submitted to above mentioned insurance via CoverMyMeds Key/confirmation #/EOC [id number] Status is pending

## 2024-03-12 ENCOUNTER — Other Ambulatory Visit: Payer: Self-pay | Admitting: Neurology

## 2024-03-12 ENCOUNTER — Ambulatory Visit: Payer: Self-pay | Admitting: Neurology

## 2024-03-12 LAB — VITAMIN D 25 HYDROXY (VIT D DEFICIENCY, FRACTURES): Vit D, 25-Hydroxy: 19.5 ng/mL — ABNORMAL LOW (ref 30.0–100.0)

## 2024-03-12 MED ORDER — VITAMIN D (ERGOCALCIFEROL) 1.25 MG (50000 UNIT) PO CAPS: ORAL_CAPSULE | ORAL | 1 refills | Status: AC

## 2024-03-12 NOTE — Telephone Encounter (Signed)
 In Response to your Message

## 2024-03-17 NOTE — Telephone Encounter (Signed)
 Lmtcb.  (Re: when did she start the medication.  Schedule F/U)

## 2024-03-27 ENCOUNTER — Telehealth: Payer: Self-pay

## 2024-03-27 NOTE — Telephone Encounter (Signed)
-----   Message from Nurse [place] B sent at 02/04/2024 11:24 AM EDT ----- Regarding: H. pylori stool test H. Pylori stool test due - order in epic - remind patient via MyChart.

## 2024-04-23 ENCOUNTER — Ambulatory Visit (HOSPITAL_COMMUNITY)
Admission: RE | Admit: 2024-04-23 | Discharge: 2024-04-23 | Disposition: A | Discharge status: Home / Self Care | Source: Ambulatory Visit | Attending: Internal Medicine | Admitting: Internal Medicine

## 2024-04-23 DIAGNOSIS — R9082 White matter disease, unspecified: Secondary | ICD-10-CM | POA: Insufficient documentation

## 2024-04-23 DIAGNOSIS — Q2112 Patent foramen ovale: Secondary | ICD-10-CM | POA: Insufficient documentation

## 2024-04-23 DIAGNOSIS — G40909 Epilepsy, unspecified, not intractable, without status epilepticus: Secondary | ICD-10-CM | POA: Diagnosis not present

## 2024-04-23 DIAGNOSIS — Z87891 Personal history of nicotine dependence: Secondary | ICD-10-CM | POA: Insufficient documentation

## 2024-04-23 DIAGNOSIS — Q211 Atrial septal defect, unspecified: Secondary | ICD-10-CM | POA: Diagnosis not present

## 2024-04-23 DIAGNOSIS — G35 Multiple sclerosis: Secondary | ICD-10-CM | POA: Insufficient documentation

## 2024-04-23 LAB — ECHOCARDIOGRAM COMPLETE BUBBLE STUDY
Area-P 1/2: 3.93 cm2
S' Lateral: 2.3 cm

## 2024-04-27 ENCOUNTER — Encounter: Payer: Self-pay | Admitting: Neurology

## 2024-04-27 DIAGNOSIS — Q2112 Patent foramen ovale: Secondary | ICD-10-CM

## 2024-04-28 ENCOUNTER — Telehealth: Payer: Self-pay | Admitting: Neurology

## 2024-04-28 NOTE — Telephone Encounter (Signed)
 Referral to Cardiology faxed to [facility]  [facility]  Phone:[phone] Fax: [phone]

## 2024-04-28 NOTE — Telephone Encounter (Signed)
Referral placed, pt aware 

## 2024-05-07 ENCOUNTER — Other Ambulatory Visit: Payer: Self-pay

## 2024-05-07 ENCOUNTER — Emergency Department (HOSPITAL_BASED_OUTPATIENT_CLINIC_OR_DEPARTMENT_OTHER)

## 2024-05-07 ENCOUNTER — Emergency Department (HOSPITAL_BASED_OUTPATIENT_CLINIC_OR_DEPARTMENT_OTHER)
Admission: EM | Admit: 2024-05-07 | Discharge: 2024-05-07 | Disposition: A | Discharge status: Home / Self Care | Attending: Emergency Medicine | Admitting: Emergency Medicine

## 2024-05-07 ENCOUNTER — Encounter (HOSPITAL_BASED_OUTPATIENT_CLINIC_OR_DEPARTMENT_OTHER): Payer: Self-pay

## 2024-05-07 DIAGNOSIS — R202 Paresthesia of skin: Secondary | ICD-10-CM | POA: Diagnosis present

## 2024-05-07 DIAGNOSIS — M79661 Pain in right lower leg: Secondary | ICD-10-CM | POA: Insufficient documentation

## 2024-05-07 DIAGNOSIS — R5383 Other fatigue: Secondary | ICD-10-CM | POA: Insufficient documentation

## 2024-05-07 LAB — CBC WITH DIFFERENTIAL/PLATELET
Abs Immature Granulocytes: 0.04 K/uL (ref 0.00–0.07)
Basophils Absolute: 0.1 K/uL (ref 0.0–0.1)
Basophils Relative: 1 %
Eosinophils Absolute: 0.2 K/uL (ref 0.0–0.5)
Eosinophils Relative: 2 %
HCT: 42.1 % (ref 39.0–52.0)
Hemoglobin: 14.7 g/dL (ref 13.0–17.0)
Immature Granulocytes: 1 %
Lymphocytes Relative: 22 %
Lymphs Abs: 1.9 K/uL (ref 0.7–4.0)
MCH: 30.7 pg (ref 26.0–34.0)
MCHC: 34.9 g/dL (ref 30.0–36.0)
MCV: 87.9 fL (ref 80.0–100.0)
Monocytes Absolute: 0.6 K/uL (ref 0.1–1.0)
Monocytes Relative: 7 %
Neutro Abs: 5.8 K/uL (ref 1.7–7.7)
Neutrophils Relative %: 67 %
Platelets: 261 K/uL (ref 150–400)
RBC: 4.79 MIL/uL (ref 4.22–5.81)
RDW: 11.4 % — ABNORMAL LOW (ref 11.5–15.5)
WBC: 8.5 K/uL (ref 4.0–10.5)
nRBC: 0 % (ref 0.0–0.2)

## 2024-05-07 LAB — COMPREHENSIVE METABOLIC PANEL WITH GFR
ALT: 15 U/L (ref 0–44)
AST: 17 U/L (ref 15–41)
Albumin: 4 g/dL (ref 3.5–5.0)
Alkaline Phosphatase: 80 U/L (ref 38–126)
Anion gap: 10 (ref 5–15)
BUN: 11 mg/dL (ref 6–20)
CO2: 24 mmol/L (ref 22–32)
Calcium: 8.9 mg/dL (ref 8.9–10.3)
Chloride: 103 mmol/L (ref 98–111)
Creatinine, Ser: 0.9 mg/dL (ref 0.61–1.24)
GFR, Estimated: >60 mL/min (ref >60)
Glucose, Bld: 95 mg/dL (ref 70–99)
Potassium: 4.1 mmol/L (ref 3.5–5.1)
Sodium: 137 mmol/L (ref 135–145)
Total Bilirubin: 0.6 mg/dL (ref 0.0–1.2)
Total Protein: 6.2 g/dL — ABNORMAL LOW (ref 6.5–8.1)

## 2024-05-07 LAB — MAGNESIUM: Magnesium: 2 mg/dL (ref 1.7–2.4)

## 2024-05-07 MED ORDER — ASPIRIN 81 MG PO CHEW
81.0000 mg | CHEWABLE_TABLET | Freq: Once | ORAL | Status: DC
  Filled 2024-05-07: qty 1

## 2024-05-07 MED ORDER — IOHEXOL 350 MG/ML SOLN
75.0000 mL | Freq: Once | INTRAVENOUS | Status: AC | PRN
  Administered 2024-05-07: 75 mL via INTRAVENOUS

## 2024-05-07 NOTE — ED Notes (Signed)
 Pt stated she took her home baby aspirin  and notified Dr. Purvis

## 2024-05-07 NOTE — ED Notes (Addendum)
 Went in to DC pt and she was not in there EDP was just in room speaking to her

## 2024-05-07 NOTE — ED Notes (Signed)
Pt requested IV be taken out

## 2024-05-07 NOTE — ED Notes (Addendum)
 Pt complains of 7/10 headache. Denies wanting pain medication at the moment. Pt request 2L oxygen for headache pain.   Pt denies wanting to wear EKG stickers to to being allergic to stickers. Pt expressed being allergic to adhesive.

## 2024-05-07 NOTE — ED Triage Notes (Signed)
 Pt reports right leg pain  & numbness that started at 0730 today. Also endorses sharp head aches and just feeling out of it and slow in the head.

## 2024-05-07 NOTE — ED Provider Notes (Signed)
 [place] [facility] Provider Note   CSN: [id number] Arrival date & time: 05/07/24  1433     Patient presents with: Numbness   Jon Reeves is a 48 y.o. adult.With a past history of trigeminal neuralgia, seizures and white matter abnormality of the brain who presents to the ED for paresthesias.  The patient tells me she has experienced multiple neurologic symptoms for the last 17 years.  These were initially thought to be due to multiple sclerosis however she no longer formally carries this diagnosis.  Recent echocardiogram showed a patent foramen ovale and recent MRI of the brain showed white matter abnormalities.  She has referral from her neurology team to [facility] cardiology and is currently undergoing cognitive rehabilitation with a speech and language pathologist geared towards improvement of memory and task management.  Beginning 2 days ago patient experienced an episode of acute fatigue while walking on treadmill.  She may have been sleepwalking for a few minutes but cannot recall the specific duration of the symptom.  Also beginning yesterday evening she had diminished sensation right lower extremity along with sharp pain.  No pain upon arrival to the ED today but she does feel continual sensory deficits which she describes as anesthesia wearing off.  Patient does suffer from frequent falls but has not had any major falls in the last couple days which preceded the symptoms.  No other sensory deficits, fevers, chills, recent illness, chest pain or shortness of breath.  No prior history of venous thromboembolism.  HPI     Prior to Admission medications   Medication Sig Start Date End Date Taking? Authorizing Provider  acetaminophen  (TYLENOL ) 500 MG tablet Take 1,000 mg by mouth 2 (two) times daily as needed for moderate pain or headache.    [provider]  baclofen (LIORESAL) 10 MG tablet Take 10 mg by mouth 2 (two) times daily as needed for  muscle spasms. Patient not taking: Reported on 12/16/2023    [provider]  carbamazepine (TEGRETOL) 100 MG chewable tablet Chew 100 mg by mouth 2 (two) times daily as needed (Trigeminal neuralgia). Patient not taking: Reported on 12/16/2023    [provider]  cyclobenzaprine  (FLEXERIL ) 10 MG tablet Take 1 tablet (10 mg total) by mouth 3 (three) times daily as needed for muscle spasms. Patient not taking: Reported on 03/11/2024 11/02/17   Ward, Josette SAILOR, DO  dicyclomine  (BENTYL ) 10 MG capsule Take 1 capsule (10 mg total) by mouth every 8 (eight) hours as needed for spasms. Patient not taking: Reported on 03/11/2024 12/16/23   Suzann Inocente CHRISTELLA, MD  dutasteride (AVODART) 0.5 MG capsule Take 0.5 mg by mouth daily.    [provider]  emtricitabine-tenofovir (TRUVADA) 200-300 MG tablet Take 1 tablet by mouth daily.    [provider]  EPINEPHrine  0.3 mg/0.3 mL IJ SOAJ injection Inject into the muscle. 05/24/18   [provider]  Estrone (ESTRO-5 IM) Inject into the muscle. Estrogen injections 0.15 every 7 days.    [provider]  gabapentin (NEURONTIN) 300 MG capsule Take 900 mg by mouth 3 (three) times daily.     [provider]  LORazepam (ATIVAN) 1 MG tablet 1 tablet as needed Orally Once a day for 30 days 08/18/21   [provider]  modafinil  (PROVIGIL ) 200 MG tablet Take 1 tablet (200 mg total) by mouth daily. 03/11/24   Sater, Charlie LABOR, MD  omeprazole  (PRILOSEC) 20 MG capsule Take 1 capsule (20 mg  total) by mouth 2 (two) times daily for 10 days. Patient not taking: Reported on 03/11/2024 03/10/24 03/20/24  Suzann Inocente HERO, MD  ondansetron  (ZOFRAN ) 4 MG tablet Take 1 tablet (4 mg total) by mouth every 4 (four) hours as needed for nausea or vomiting. Patient not taking: Reported on 03/11/2024 06/07/17   Lily Boas, MD  progesterone (PROMETRIUM) 200 MG capsule Take 200 mg by mouth at bedtime.    [provider]  Vitamin D ,  Ergocalciferol , (DRISDOL ) 1.25 MG (50000 UNIT) CAPS capsule One po q week 03/16/24   Sater, Charlie LABOR, MD    Allergies: Glatiramer, Adhesive [tape], and Penicillins    Review of Systems  Updated Vital Signs BP 116/83   Pulse 82   Temp 98.5 F (36.9 C) (Oral)   Resp 18   SpO2 100%   Physical Exam Vitals and nursing note reviewed.  HENT:     Head: Normocephalic and atraumatic.  Eyes:     Pupils: Pupils are equal, round, and reactive to light.  Cardiovascular:     Rate and Rhythm: Normal rate and regular rhythm.  Pulmonary:     Effort: Pulmonary effort is normal.     Breath sounds: Normal breath sounds.  Abdominal:     Palpations: Abdomen is soft.     Tenderness: There is no abdominal tenderness.  Musculoskeletal:        General: No swelling or deformity.  Skin:    General: Skin is warm and dry.     Findings: No erythema or rash.  Neurological:     Mental Status: She is alert.     Comments: Diminished sensation to light touch over lower right leg 5 out of 5 motor strength in bilateral upper and lower extremities with no pronator drift Normal finger-nose and heel-to-shin Awake alert oriented  Psychiatric:        Mood and Affect: Mood normal.     (all labs ordered are listed, but only abnormal results are displayed) Labs Reviewed  COMPREHENSIVE METABOLIC PANEL WITH GFR - Abnormal; Notable for the following components:      Result Value   Total Protein 6.2 (*)    All other components within normal limits  CBC WITH DIFFERENTIAL/PLATELET - Abnormal; Notable for the following components:   RDW 11.4 (*)    All other components within normal limits  MAGNESIUM    EKG: EKG Interpretation Date/Time:  Thursday May 07 2024 14:44:30 EDT Ventricular Rate:  108 PR Interval:  129 QRS Duration:  70 QT Interval:  383 QTC Calculation: 514 R Axis:   72  Text Interpretation: Sinus tachycardia Nonspecific T abnrm, anterolateral leads Prolonged QT interval Since last tracing  rate faster Confirmed by Randol Simmonds [phone]) on 05/07/2024 2:51:33 PM  Radiology: CT ANGIO HEAD NECK W WO CM Result Date: 05/07/2024 CLINICAL DATA:  RLE weakness started yesterday, ?CVA EXAM: CT ANGIOGRAPHY HEAD AND NECK WITH AND WITHOUT CONTRAST TECHNIQUE: Multidetector CT imaging of the head and neck was performed using the standard protocol during bolus administration of intravenous contrast. Multiplanar CT image reconstructions and MIPs were obtained to evaluate the vascular anatomy. Carotid stenosis measurements (when applicable) are obtained utilizing NASCET criteria, using the distal internal carotid diameter as the denominator. RADIATION DOSE REDUCTION: This exam was performed according to the departmental dose-optimization program which includes automated exposure control, adjustment of the mA and/or kV according to patient size and/or use of iterative reconstruction technique. CONTRAST:  75mL OMNIPAQUE  IOHEXOL  350 MG/ML SOLN COMPARISON:  CT head  11/01/2021. FINDINGS: CT HEAD FINDINGS Brain: No evidence of acute infarction, hemorrhage, hydrocephalus, extra-axial collection or mass lesion/mass effect. Vascular: See below. Skull: No acute fracture. Sinuses/Orbits: Clear sinuses.  No acute orbital findings. Other: No mastoid effusions. Review of the MIP images confirms the above findings CTA NECK FINDINGS Aortic arch: Great vessel origins are patent without significant stenosis. Right carotid system: No evidence of dissection, stenosis (50% or greater), or occlusion. Left carotid system: No evidence of dissection, stenosis (50% or greater), or occlusion. Vertebral arteries: No evidence of dissection, stenosis (50% or greater), or occlusion. Left dominant. Skeleton: No acute abnormality on limited assessment. Other neck: No acute abnormality on limited assessment. Upper chest: Visualized lung apices are clear.  Emphysema. Review of the MIP images confirms the above findings CTA HEAD FINDINGS Anterior  circulation: Bilateral intracranial ICAs, MCAs, and ACAs are patent without proximal hemodynamically significant stenosis. No aneurysm identified. Posterior circulation: Bilateral intradural vertebral arteries, basilar artery and bilateral posterior cerebral arteries are patent without proximal hemodynamically significant stenosis. No aneurysm identified. Venous sinuses: As permitted by contrast timing, patent. Review of the MIP images confirms the above findings IMPRESSION: No large vessel occlusion or proximal hemodynamically significant stenosis. Electronically Signed   By: Gilmore GORMAN Molt M.D.   On: 05/07/2024 20:05   US  Venous Img Lower Unilateral Right Result Date: 05/07/2024 CLINICAL DATA:  Right lower extremity pain and numbness. EXAM: RIGHT LOWER EXTREMITY VENOUS DOPPLER ULTRASOUND TECHNIQUE: Gray-scale sonography with graded compression, as well as color Doppler and duplex ultrasound were performed to evaluate the lower extremity deep venous systems from the level of the common femoral vein and including the common femoral, femoral, profunda femoral, popliteal and calf veins including the posterior tibial, peroneal and gastrocnemius veins when visible. The superficial great saphenous vein was also interrogated. Spectral Doppler was utilized to evaluate flow at rest and with distal augmentation maneuvers in the common femoral, femoral and popliteal veins. COMPARISON:  None Available. FINDINGS: Contralateral Common Femoral Vein: Respiratory phasicity is normal and symmetric with the symptomatic side. No evidence of thrombus. Normal compressibility. Common Femoral Vein: No evidence of thrombus. Normal compressibility, respiratory phasicity and response to augmentation. Saphenofemoral Junction: No evidence of thrombus. Normal compressibility and flow on color Doppler imaging. Profunda Femoral Vein: No evidence of thrombus. Normal compressibility and flow on color Doppler imaging. Femoral Vein: No evidence  of thrombus. Normal compressibility, respiratory phasicity and response to augmentation. Popliteal Vein: No evidence of thrombus. Normal compressibility, respiratory phasicity and response to augmentation. Calf Veins: No evidence of thrombus. Normal compressibility and flow on color Doppler imaging. Superficial Great Saphenous Vein: No evidence of thrombus. Normal compressibility. Venous Reflux:  None. Other Findings: No evidence of superficial thrombophlebitis or abnormal fluid collection. IMPRESSION: No evidence of right lower extremity deep venous thrombosis. Electronically Signed   By: Marcey Moan M.D.   On: 05/07/2024 16:44     Procedures   Medications Ordered in the ED  aspirin  chewable tablet 81 mg (81 mg Oral Not Given 05/07/24 1643)  iohexol  (OMNIPAQUE ) 350 MG/ML injection 75 mL (75 mLs Intravenous Contrast Given 05/07/24 1755)    Clinical Course as of 05/07/24 2049  Thu May 07, 2024  2048 EKG shows sinus tachycardia with slightly prolonged QTc.  Right lower extremity ultrasound shows no DVT.  CTA head and neck shows no large vessel occlusion or hemorrhage.  CMP CBC all within normal limits.  I reviewed these findings with the patient.  She feels well enough to go home at this  time and is reassured.  She will follow-up with her neurologist [MP]    Clinical Course User Index [MP] Pamella Ozell LABOR, DO                                 Medical Decision Making 48 year old transgender (male to male) with history as above presenting to the ED given concern for new onset neurologic deficits.  Severe fatigue and headaches 2 days ago.  Atraumatic right lower extremity pain and paresthesias.  Persistent sensory deficit with diminished sensation to light touch in the right lower extremity on my examination.  She is afebrile slightly hypertensive.  Heart rate in the low 100s.  Resting comfortably at this time.  Outside of stroke window for activation.  Differential diagnosis would include  TIA/CVA, DVT, dysrhythmia, electrolyte imbalance and anemia.  Will obtain imaging studies including CTA head and neck, ultrasound right lower extremity, EKG, laboratory workup and continue to monitor.  Will provide chewable aspirin  while here in the emergency department.  Amount and/or Complexity of Data Reviewed Labs: ordered. Radiology: ordered.  Risk OTC drugs. Prescription drug management.        Final diagnoses:  Paresthesia    ED Discharge Orders     None          Pamella Ozell LABOR, DO 05/07/24 2049

## 2024-05-07 NOTE — Discharge Instructions (Signed)
 You were seen in the emergency department for decree sensation in the right leg The CAT scan of your head and neck showed no evidence of stroke bleeding or blockage of your blood vessels The ultrasound of your legs showed no evidence of blood clot Your blood work looked okay Your EKG showed sinus tachycardia with a slightly prolonged QT interval Please follow-up with your neurologist to discuss today's visit and additional testing Return to the emerged part for worsening of symptoms, weakness on one side of your body or any other concerns

## 2024-06-08 ENCOUNTER — Encounter: Payer: Self-pay | Admitting: Neurology

## 2024-09-08 ENCOUNTER — Other Ambulatory Visit (HOSPITAL_BASED_OUTPATIENT_CLINIC_OR_DEPARTMENT_OTHER): Payer: Self-pay | Admitting: Internal Medicine

## 2024-09-08 DIAGNOSIS — E7800 Pure hypercholesterolemia, unspecified: Secondary | ICD-10-CM

## 2024-09-30 ENCOUNTER — Encounter (HOSPITAL_BASED_OUTPATIENT_CLINIC_OR_DEPARTMENT_OTHER): Payer: Self-pay

## 2024-09-30 ENCOUNTER — Other Ambulatory Visit (HOSPITAL_BASED_OUTPATIENT_CLINIC_OR_DEPARTMENT_OTHER)
